# Patient Record
Sex: Female | Born: 1975 | Hispanic: Yes | Marital: Single | Smoking: Never smoker
Health system: Southern US, Community
[De-identification: ages and names within clinical notes are randomized; demographics above are authoritative.]

## PROBLEM LIST (undated history)

## (undated) DIAGNOSIS — I10 Essential (primary) hypertension: Secondary | ICD-10-CM

## (undated) DIAGNOSIS — J45909 Unspecified asthma, uncomplicated: Secondary | ICD-10-CM

## (undated) DIAGNOSIS — R87629 Unspecified abnormal cytological findings in specimens from vagina: Secondary | ICD-10-CM

## (undated) DIAGNOSIS — K760 Fatty (change of) liver, not elsewhere classified: Secondary | ICD-10-CM

## (undated) HISTORY — DX: Essential (primary) hypertension: I10

## (undated) HISTORY — PX: TUBAL LIGATION: SHX77

## (undated) HISTORY — PX: BREAST BIOPSY: SHX20

## (undated) HISTORY — DX: Unspecified abnormal cytological findings in specimens from vagina: R87.629

## (undated) HISTORY — PX: GALLBLADDER SURGERY: SHX652

## (undated) HISTORY — PX: COLONOSCOPY: SHX174

---

## 2005-09-18 DIAGNOSIS — O1490 Unspecified pre-eclampsia, unspecified trimester: Secondary | ICD-10-CM

## 2017-03-29 ENCOUNTER — Encounter (HOSPITAL_COMMUNITY): Payer: Self-pay | Admitting: Emergency Medicine

## 2017-03-29 ENCOUNTER — Emergency Department (HOSPITAL_COMMUNITY)
Admission: EM | Admit: 2017-03-29 | Discharge: 2017-03-29 | Disposition: A | Discharge status: Home / Self Care | Payer: Medicaid Other | Attending: Emergency Medicine | Admitting: Emergency Medicine

## 2017-03-29 ENCOUNTER — Emergency Department (HOSPITAL_COMMUNITY): Payer: Medicaid Other

## 2017-03-29 DIAGNOSIS — M791 Myalgia, unspecified site: Secondary | ICD-10-CM

## 2017-03-29 DIAGNOSIS — R21 Rash and other nonspecific skin eruption: Secondary | ICD-10-CM

## 2017-03-29 DIAGNOSIS — R7989 Other specified abnormal findings of blood chemistry: Secondary | ICD-10-CM

## 2017-03-29 DIAGNOSIS — R945 Abnormal results of liver function studies: Secondary | ICD-10-CM

## 2017-03-29 DIAGNOSIS — R6883 Chills (without fever): Secondary | ICD-10-CM

## 2017-03-29 DIAGNOSIS — R52 Pain, unspecified: Secondary | ICD-10-CM

## 2017-03-29 LAB — COMPREHENSIVE METABOLIC PANEL
ALT: 141 U/L — ABNORMAL HIGH (ref 14–54)
ANION GAP: 10 (ref 5–15)
AST: 88 U/L — ABNORMAL HIGH (ref 15–41)
Albumin: 3.7 g/dL (ref 3.5–5.0)
Alkaline Phosphatase: 104 U/L (ref 38–126)
BILIRUBIN TOTAL: 0.8 mg/dL (ref 0.3–1.2)
BUN: 9 mg/dL (ref 6–20)
CO2: 24 mmol/L (ref 22–32)
Calcium: 9.2 mg/dL (ref 8.9–10.3)
Chloride: 103 mmol/L (ref 101–111)
Creatinine, Ser: 0.92 mg/dL (ref 0.44–1.00)
Glucose, Bld: 101 mg/dL — ABNORMAL HIGH (ref 65–99)
POTASSIUM: 4 mmol/L (ref 3.5–5.1)
Sodium: 137 mmol/L (ref 135–145)
TOTAL PROTEIN: 7.6 g/dL (ref 6.5–8.1)

## 2017-03-29 LAB — URINALYSIS, ROUTINE W REFLEX MICROSCOPIC
BILIRUBIN URINE: NEGATIVE
GLUCOSE, UA: NEGATIVE mg/dL
KETONES UR: NEGATIVE mg/dL
LEUKOCYTES UA: NEGATIVE
Nitrite: NEGATIVE
PH: 5 (ref 5.0–8.0)
Protein, ur: NEGATIVE mg/dL
Specific Gravity, Urine: 1.015 (ref 1.005–1.030)

## 2017-03-29 LAB — CBC WITH DIFFERENTIAL/PLATELET
BASOS ABS: 0.1 10*3/uL (ref 0.0–0.1)
Basophils Relative: 1 %
EOS PCT: 1 %
Eosinophils Absolute: 0.1 10*3/uL (ref 0.0–0.7)
HEMATOCRIT: 37.9 % (ref 36.0–46.0)
HEMOGLOBIN: 12.6 g/dL (ref 12.0–15.0)
LYMPHS PCT: 12 %
Lymphs Abs: 1.1 10*3/uL (ref 0.7–4.0)
MCH: 26.9 pg (ref 26.0–34.0)
MCHC: 33.2 g/dL (ref 30.0–36.0)
MCV: 81 fL (ref 78.0–100.0)
Monocytes Absolute: 0.8 10*3/uL (ref 0.1–1.0)
Monocytes Relative: 8 %
NEUTROS ABS: 7.3 10*3/uL (ref 1.7–7.7)
NEUTROS PCT: 78 %
PLATELETS: 280 10*3/uL (ref 150–400)
RBC: 4.68 MIL/uL (ref 3.87–5.11)
RDW: 14.6 % (ref 11.5–15.5)
WBC: 9.3 10*3/uL (ref 4.0–10.5)

## 2017-03-29 LAB — I-STAT CG4 LACTIC ACID, ED
LACTIC ACID, VENOUS: 1.8 mmol/L (ref 0.5–1.9)
Lactic Acid, Venous: 1.01 mmol/L (ref 0.5–1.9)

## 2017-03-29 LAB — POC URINE PREG, ED: Preg Test, Ur: NEGATIVE

## 2017-03-29 MED ORDER — PREDNISONE 50 MG PO TABS: 50.0000 mg | ORAL_TABLET | Freq: Every day | ORAL | 0 refills | Status: AC

## 2017-03-29 MED ORDER — SODIUM CHLORIDE 0.9 % IV BOLUS (SEPSIS)
1000.0000 mL | Freq: Once | INTRAVENOUS | Status: AC
  Administered 2017-03-29: 1000 mL via INTRAVENOUS

## 2017-03-29 MED ORDER — DOXYCYCLINE HYCLATE 100 MG PO CAPS: 100.0000 mg | ORAL_CAPSULE | Freq: Two times a day (BID) | ORAL | 0 refills | Status: AC

## 2017-03-29 MED ORDER — IBUPROFEN 800 MG PO TABS
800.0000 mg | ORAL_TABLET | Freq: Once | ORAL | Status: AC
  Administered 2017-03-29: 800 mg via ORAL
  Filled 2017-03-29: qty 1

## 2017-03-29 MED ORDER — DOXYCYCLINE HYCLATE 100 MG PO TABS
100.0000 mg | ORAL_TABLET | Freq: Once | ORAL | Status: AC
  Administered 2017-03-29: 100 mg via ORAL
  Filled 2017-03-29: qty 1

## 2017-03-29 MED ORDER — PREDNISONE 20 MG PO TABS
60.0000 mg | ORAL_TABLET | Freq: Once | ORAL | Status: AC
  Administered 2017-03-29: 60 mg via ORAL
  Filled 2017-03-29: qty 3

## 2017-03-29 NOTE — Discharge Instructions (Signed)
Please take the doxycycline to empirically treat [facility]. Please take the steroids to help with your rash. Please stay hydrated. Please follow-up with your primary care physician for further management. If any symptoms change or worsen, please return to the nearest emergency department.

## 2017-03-29 NOTE — ED Provider Notes (Signed)
[facility]-EMERGENCY DEPT Provider Note   CSN: [id number] Arrival date & time: 03/29/17  1315     History   Chief Complaint Chief Complaint  Patient presents with  . Generalized Body Aches    HPI Chelsey Jordan is a 41 y.o. female.  The history is provided by the patient and the spouse. The history is limited by a language barrier.  Rash   This is a new problem. The current episode started more than 1 week ago. The problem has been gradually worsening. The problem is associated with nothing. The pain is moderate. The pain has been constant since onset. Associated symptoms include pain. She has tried nothing for the symptoms. The treatment provided no relief.    History reviewed. No pertinent past medical history.  There are no active problems to display for this patient.   History reviewed. No pertinent surgical history.  OB History    No data available       Home Medications    Prior to Admission medications   Not on File    Family History No family history on file.  Social History Social History  Substance Use Topics  . Smoking status: Never Smoker  . Smokeless tobacco: Never Used  . Alcohol use No     Allergies   Patient has no allergy information on record.   Review of Systems Review of Systems  Constitutional: Positive for chills, fatigue and fever. Negative for appetite change and diaphoresis.  HENT: Negative for congestion, rhinorrhea, sore throat, trouble swallowing and voice change.   Eyes: Negative for visual disturbance.  Respiratory: Negative for cough, chest tightness, shortness of breath, wheezing and stridor.   Cardiovascular: Negative for chest pain (resolved) and palpitations.  Gastrointestinal: Negative for abdominal pain, constipation, diarrhea, nausea and vomiting.  Genitourinary: Negative for difficulty urinating and dysuria.  Musculoskeletal: Negative for back pain, neck pain and neck stiffness.  Skin: Positive for rash.    Neurological: Positive for headaches. Negative for seizures, weakness and light-headedness.  Psychiatric/Behavioral: Negative for agitation and confusion.  All other systems reviewed and are negative.    Physical Exam Updated Vital Signs BP 118/74 (BP Location: Left Arm)   Pulse (!) 113   Temp 99.3 F (37.4 C) (Oral)   Resp 18   SpO2 98%   Physical Exam  Constitutional: She is oriented to person, place, and time. She appears well-developed and well-nourished. No distress.  HENT:  Head: Normocephalic and atraumatic.  Nose: Nose normal.  Mouth/Throat: Oropharynx is clear and moist. No oropharyngeal exudate.  Eyes: Pupils are equal, round, and reactive to light. Conjunctivae and EOM are normal.  Neck: Normal range of motion.  Cardiovascular: Normal heart sounds and intact distal pulses.  Tachycardia present.   No murmur heard. Pulmonary/Chest: Effort normal. No stridor. No respiratory distress. She has no wheezes. She has no rales. She exhibits no tenderness.  Abdominal: Soft. There is no tenderness.  Musculoskeletal: She exhibits no edema, tenderness or deformity.  Neurological: She is alert and oriented to person, place, and time. No sensory deficit. She exhibits normal muscle tone.  Skin: Capillary refill takes less than 2 seconds. Rash noted. Rash is maculopapular. She is not diaphoretic. There is erythema.  Nursing note and vitals reviewed.    ED Treatments / Results  Labs (all labs ordered are listed, but only abnormal results are displayed) Labs Reviewed  COMPREHENSIVE METABOLIC PANEL - Abnormal; Notable for the following:       Result Value   Glucose, Bld  101 (*)    AST 88 (*)    ALT 141 (*)    All other components within normal limits  URINALYSIS, ROUTINE W REFLEX MICROSCOPIC - Abnormal; Notable for the following:    APPearance HAZY (*)    Hgb urine dipstick MODERATE (*)    Bacteria, UA MANY (*)    Squamous Epithelial / LPF 0-5 (*)    All other components  within normal limits  URINE CULTURE  CBC WITH DIFFERENTIAL/PLATELET  ROCKY MTN SPOTTED FVR ABS PNL(IGG+IGM)  B. BURGDORFI ANTIBODIES  I-STAT CG4 LACTIC ACID, ED  POC URINE PREG, ED  I-STAT CG4 LACTIC ACID, ED    EKG  EKG Interpretation None       Radiology Dg Chest 2 View  Result Date: 03/29/2017 CLINICAL DATA:  Three-week history of cough, generalized weakness and diffuse body rash. EXAM: CHEST  2 VIEW COMPARISON:  None. FINDINGS: Lateral image is suboptimal due to underexposure related to body habitus. Suboptimal inspiration accounts for crowded bronchovascular markings diffusely and atelectasis in the bases, and accentuates the cardiac silhouette. Taking this into account, cardiomediastinal silhouette unremarkable. Lungs otherwise clear. Normal pulmonary vascularity. No visible pleural effusions. IMPRESSION: Suboptimal inspiration accounts for bibasilar atelectasis. No acute cardiopulmonary disease otherwise. Electronically Signed   By: Hulan Saas M.D.   On: 03/29/2017 16:23    Procedures Procedures (including critical care time)  Medications Ordered in ED Medications  sodium chloride 0.9 % bolus 1,000 mL (0 mLs Intravenous Stopped 03/29/17 1817)  doxycycline (VIBRA-TABS) tablet 100 mg (100 mg Oral Given 03/29/17 1708)  predniSONE (DELTASONE) tablet 60 mg (60 mg Oral Given 03/29/17 1707)  ibuprofen (ADVIL,MOTRIN) tablet 800 mg (800 mg Oral Given 03/29/17 1707)  sodium chloride 0.9 % bolus 1,000 mL (1,000 mLs Intravenous New Bag/Given 03/29/17 1910)     Initial Impression / Assessment and Plan / ED Course  I have reviewed the triage vital signs and the nursing notes.  Pertinent labs & imaging results that were available during my care of the patient were reviewed by me and considered in my medical decision making (see chart for details).     Chelsey Jordan is a 41 y.o. female with no significant past medical history who presents with fevers, chills, malaise, generalized  body aching, rash, headache, and dry cough. Patient reports that over the last 2 weeks, she developed rash all over her body. It is sparing her palms and soles. It does not involve her mucosal membranes. She says that she worked outside several weeks ago but did not know of any tick exposures. She describes the rash is nonpruritic but painful. She says that she developed chills, aching, and went to see a urgent care on Saturday. In urgent care, she told she had some chest discomfort with her generalized aching and she says they gave her aspirin and nitroglycerin. She says this made her headache worse and did not relieve her overall symptoms. She says that the next day she actually felt better but returned on Monday to the urgent care for continued symptoms. At that time, she was told she had an allergic reaction and was given a steroid shot and Atarax. She reports feeling better on Monday for the rest of the day but Tuesday symptoms returned. She says that she is continued to have a dry cough. She denies any nausea, vomiting, conservation, diarrhea, dysuria. She denies any traumas. He denies any sick contacts. She has never had this before.  She denies any neck pain or neck stiffness.  She denies any neurologic deficits. She denies abdominal pain. Denies any current chest pain or shortness of breath.  On exam, patient has large painful erythematous round rashes all over her body. Some areas have central clearing. Rash does appear raised in some places. Lungs are clear. Chest is nontender. Abdomen nontender. No focal neurologic deficits on exam. No rash and mouth or on gums.  Based on symptoms of chills, headache, rash, and fever in this area the country, [facility] spotted fever or other tickborne illnesses strongly considered. Patient will be empirically treated with doxycycline. As patient was tachycardic on arrival, she'll be given fluids and rehydrated. She will have screening laboratory testing.  Laboratory testing from triage show elevation in liver function. This may recommend spotted fever. Patient is not having tenderness over the right upper quadrant, doubt acute cholecystitis. Patient will have RMSF and Lyme tests collected. Also considered are discoid lupus or cutaneous lupus with erythema migrans appearing rashes. Patient may also have a component of allergic reaction some of the rashes appear wheal like.   They stop lack of neck pain and neck stiffness, and the appearance of the rash, do not feel patient has meningitis.  Patient will be given prednisone as well as the doxycycline. Patient we given fluids. Patient will have chest x-ray given the cough as well as urine testing.  Anticipate reassessment after medications. Patient is feeling better, suspect patient will be stable for discharge with close PCP follow-up for further workup to look for lupus as an outpatient.  8:41 PM Patient reassessed with improvement in tachycardia, fatigue, and complaints. Patient had resolution of her headache and body aching. Patient's rash began to improve after steroids.  Patient will begin a prescription for steroids. Patient also be given prescription for doxycycline to impaired to treat for RMSF. Tickborne illness labs were sent. Patient instructed to follow-up with PCP in the next several days for reassessment as well as for further workup of possible lupus. Family does report that there is a family history of lupus.  Patient family understood return precautions and plan of care. Patient discharged in good condition.   Final Clinical Impressions(s) / ED Diagnoses   Final diagnoses:  Rash  Myalgia  Body aches  Chills  LFT elevation    New Prescriptions New Prescriptions   DOXYCYCLINE (VIBRAMYCIN) 100 MG CAPSULE    Take 1 capsule (100 mg total) by mouth 2 (two) times daily.   PREDNISONE (DELTASONE) 50 MG TABLET    Take 1 tablet (50 mg total) by mouth daily.    Clinical  Impression: 1. Rash   2. Myalgia   3. Body aches   4. Chills   5. LFT elevation     Disposition: Discharge  Condition: Good  I have discussed the results, Dx and Tx plan with the pt(& family if present). He/she/they expressed understanding and agree(s) with the plan. Discharge instructions discussed at great length. Strict return precautions discussed and pt &/or family have verbalized understanding of the instructions. No further questions at time of discharge.    New Prescriptions   DOXYCYCLINE (VIBRAMYCIN) 100 MG CAPSULE    Take 1 capsule (100 mg total) by mouth 2 (two) times daily.   PREDNISONE (DELTASONE) 50 MG TABLET    Take 1 tablet (50 mg total) by mouth daily.    Follow Up: [facility] [address] [place] [address] [phone] Schedule an appointment as soon as possible for a visit  [facility] [address] [id number] [place] [id number] [phone]  If symptoms worsen     Tegeler, Canary Brimhristopher J, MD 03/30/17 [phone]

## 2017-03-29 NOTE — ED Triage Notes (Signed)
Pt complaint of generalized body aches with "red spots all of body" worsening over past 3 weeks; recently diagnosed with "allergic reaction from environment."

## 2017-03-29 NOTE — ED Notes (Signed)
Pt ambulatory and independent at discharge.  Verbalized understanding of discharge instructions 

## 2017-03-29 NOTE — ED Notes (Signed)
Patient transported to X-ray 

## 2017-03-30 LAB — ROCKY MTN SPOTTED FVR ABS PNL(IGG+IGM)
RMSF IgG: NEGATIVE
RMSF IgM: 0.92 index — ABNORMAL HIGH (ref 0.00–0.89)

## 2017-03-31 LAB — URINE CULTURE

## 2017-04-04 LAB — LYME, WESTERN BLOT, SERUM (REFLEXED)
IGG P45 AB.: ABSENT
IGG P58 AB.: ABSENT
IGG P66 AB.: ABSENT
IgG P18 Ab.: ABSENT
IgG P28 Ab.: ABSENT
IgG P30 Ab.: ABSENT
IgG P39 Ab.: ABSENT
IgG P93 Ab.: ABSENT
Lyme IgG Wb: NEGATIVE
Lyme IgM Wb: POSITIVE — AB

## 2017-04-04 LAB — B. BURGDORFI ANTIBODIES: B burgdorferi Ab IgG+IgM: 3.64 {ISR} — ABNORMAL HIGH (ref 0.00–0.90)

## 2018-12-05 ENCOUNTER — Encounter: Payer: Self-pay | Admitting: *Deleted

## 2018-12-06 ENCOUNTER — Other Ambulatory Visit: Payer: Self-pay | Admitting: Physician Assistant

## 2018-12-06 DIAGNOSIS — Z1231 Encounter for screening mammogram for malignant neoplasm of breast: Secondary | ICD-10-CM

## 2018-12-20 ENCOUNTER — Other Ambulatory Visit: Payer: Self-pay

## 2018-12-20 ENCOUNTER — Other Ambulatory Visit (HOSPITAL_COMMUNITY)
Admission: RE | Admit: 2018-12-20 | Discharge: 2018-12-20 | Disposition: A | Discharge status: Home / Self Care | Payer: Medicaid Other | Source: Ambulatory Visit | Attending: Obstetrics & Gynecology | Admitting: Obstetrics & Gynecology

## 2018-12-20 ENCOUNTER — Ambulatory Visit (INDEPENDENT_AMBULATORY_CARE_PROVIDER_SITE_OTHER): Payer: Medicaid Other | Admitting: Obstetrics & Gynecology

## 2018-12-20 ENCOUNTER — Encounter: Payer: Self-pay | Admitting: Obstetrics & Gynecology

## 2018-12-20 VITALS — BP 143/86 | HR 96 | Ht 63.5 in | Wt 206.7 lb

## 2018-12-20 DIAGNOSIS — Z789 Other specified health status: Secondary | ICD-10-CM | POA: Diagnosis not present

## 2018-12-20 DIAGNOSIS — N951 Menopausal and female climacteric states: Secondary | ICD-10-CM | POA: Insufficient documentation

## 2018-12-20 DIAGNOSIS — Z124 Encounter for screening for malignant neoplasm of cervix: Secondary | ICD-10-CM | POA: Diagnosis present

## 2018-12-20 DIAGNOSIS — Z6836 Body mass index (BMI) 36.0-36.9, adult: Secondary | ICD-10-CM | POA: Diagnosis not present

## 2018-12-20 DIAGNOSIS — E669 Obesity, unspecified: Secondary | ICD-10-CM | POA: Insufficient documentation

## 2018-12-20 DIAGNOSIS — N912 Amenorrhea, unspecified: Secondary | ICD-10-CM | POA: Diagnosis not present

## 2018-12-20 DIAGNOSIS — E6609 Other obesity due to excess calories: Secondary | ICD-10-CM | POA: Diagnosis not present

## 2018-12-20 DIAGNOSIS — Z3202 Encounter for pregnancy test, result negative: Secondary | ICD-10-CM

## 2018-12-20 DIAGNOSIS — Z0000 Encounter for general adult medical examination without abnormal findings: Secondary | ICD-10-CM

## 2018-12-20 LAB — CHG URINE PREGNANCY TEST: Preg Test, Ur: NEGATIVE

## 2018-12-20 MED ORDER — MEDROXYPROGESTERONE ACETATE 10 MG PO TABS: 10.0000 mg | ORAL_TABLET | Freq: Every day | ORAL | 2 refills | Status: DC

## 2018-12-20 NOTE — Progress Notes (Signed)
Patient ID: Chelsey Jordan, female   DOB: Nov 04, 1975, 43 y.o.   MRN: [id number]  Chief Complaint  Patient presents with  . Amenorrhea    HPI Chelsey Jordan is a 43 y.o. single P2 (23 and 32 yo kids) female.  She would like a pap smear. She reports an abnormal pap [place] in about [place]  She also reports that she has not had a period since 10/19.  She had a BTL in about 2008.  She has not taken any meds to induce menses. She reports that until 10/19 her periods were monthly. She cannot think of anything that happened then, denies weight gain or loss. She is abstinent for more than 2 years.   Past Medical History:  Diagnosis Date  . Hypertension   . Vaginal Pap smear, abnormal     Past Surgical History:  Procedure Laterality Date  . CESAREAN SECTION     x2  . GALLBLADDER SURGERY    . TUBAL LIGATION      Family History  Problem Relation Age of Onset  . Hypertension Father   . Heart disease Father   . Hypertension Mother   . Lung disease Mother   . Lupus Mother     Social History Social History   Tobacco Use  . Smoking status: Former Games developer  . Smokeless tobacco: Never Used  Substance Use Topics  . Alcohol use: No  . Drug use: No    No Known Allergies  Current Outpatient Medications  Medication Sig Dispense Refill  . acetaminophen (TYLENOL) 500 MG tablet Take 500 mg by mouth every 6 (six) hours as needed for headache.    . Multiple Vitamins-Minerals (MULTIVITAMIN PO) Take by mouth.     No current facility-administered medications for this visit.     Review of Systems Review of Systems  unemployed  Blood pressure (!) 143/86, pulse 96, height 5' 3.5" (1.613 m), weight 206 lb 11.2 oz (93.8 kg), last menstrual period 09/01/2018. UPT negative  Physical Exam Physical Exam Video interpretor used for visit Breathing, conversing, and ambulating normally Well nourished, well hydrated Latina, no apparent distress  Abd- benign, obese Vulva/vagina/cervix-  normal  Data Reviewed Labs noted  Assessment  Preventative care Amenorrhea  Plan Pap with cotesting Check TSH, Prolactin Mammogram scheduled in May 2020. Provera cyclicly  Chelsey Jordan 12/20/2018, 11:45 AM

## 2018-12-21 LAB — PROLACTIN: Prolactin: 12.8 ng/mL (ref 4.8–23.3)

## 2018-12-21 LAB — TSH: TSH: 1.34 u[IU]/mL (ref 0.450–4.500)

## 2018-12-25 LAB — CYTOLOGY - PAP
Chlamydia: NEGATIVE
Diagnosis: NEGATIVE
HPV: NOT DETECTED
Neisseria Gonorrhea: NEGATIVE

## 2019-01-24 ENCOUNTER — Ambulatory Visit: Payer: Self-pay

## 2019-02-20 ENCOUNTER — Ambulatory Visit
Admission: RE | Admit: 2019-02-20 | Discharge: 2019-02-20 | Disposition: A | Discharge status: Home / Self Care | Payer: Medicaid Other | Source: Ambulatory Visit | Attending: Physician Assistant | Admitting: Physician Assistant

## 2019-02-20 ENCOUNTER — Other Ambulatory Visit: Payer: Self-pay | Admitting: Physician Assistant

## 2019-02-20 ENCOUNTER — Ambulatory Visit: Payer: Medicaid Other

## 2019-02-20 ENCOUNTER — Other Ambulatory Visit: Payer: Self-pay

## 2019-02-20 DIAGNOSIS — N644 Mastodynia: Secondary | ICD-10-CM

## 2019-02-20 DIAGNOSIS — Z1231 Encounter for screening mammogram for malignant neoplasm of breast: Secondary | ICD-10-CM

## 2019-02-25 ENCOUNTER — Other Ambulatory Visit: Payer: Self-pay | Admitting: Physician Assistant

## 2019-03-07 ENCOUNTER — Other Ambulatory Visit: Payer: Self-pay | Admitting: Physician Assistant

## 2019-03-07 ENCOUNTER — Other Ambulatory Visit: Payer: Self-pay

## 2019-03-07 ENCOUNTER — Ambulatory Visit
Admission: RE | Admit: 2019-03-07 | Discharge: 2019-03-07 | Disposition: A | Discharge status: Home / Self Care | Payer: Medicaid Other | Source: Ambulatory Visit | Attending: Physician Assistant | Admitting: Physician Assistant

## 2019-03-07 DIAGNOSIS — N644 Mastodynia: Secondary | ICD-10-CM

## 2019-03-07 DIAGNOSIS — N6489 Other specified disorders of breast: Secondary | ICD-10-CM

## 2019-03-07 DIAGNOSIS — N6310 Unspecified lump in the right breast, unspecified quadrant: Secondary | ICD-10-CM

## 2019-03-10 ENCOUNTER — Ambulatory Visit
Admission: RE | Admit: 2019-03-10 | Discharge: 2019-03-10 | Disposition: A | Discharge status: Home / Self Care | Payer: Medicaid Other | Source: Ambulatory Visit | Attending: Physician Assistant | Admitting: Physician Assistant

## 2019-03-10 ENCOUNTER — Other Ambulatory Visit: Payer: Self-pay | Admitting: Physician Assistant

## 2019-03-10 ENCOUNTER — Other Ambulatory Visit: Payer: Self-pay

## 2019-03-10 DIAGNOSIS — N6310 Unspecified lump in the right breast, unspecified quadrant: Secondary | ICD-10-CM

## 2019-09-25 ENCOUNTER — Other Ambulatory Visit: Payer: Self-pay | Admitting: Physician Assistant

## 2019-09-25 DIAGNOSIS — N6320 Unspecified lump in the left breast, unspecified quadrant: Secondary | ICD-10-CM

## 2019-09-26 ENCOUNTER — Other Ambulatory Visit: Payer: Self-pay | Admitting: Physician Assistant

## 2019-09-26 DIAGNOSIS — N6320 Unspecified lump in the left breast, unspecified quadrant: Secondary | ICD-10-CM

## 2019-09-29 ENCOUNTER — Other Ambulatory Visit: Payer: Self-pay | Admitting: Physician Assistant

## 2019-09-29 DIAGNOSIS — N6320 Unspecified lump in the left breast, unspecified quadrant: Secondary | ICD-10-CM

## 2019-10-01 ENCOUNTER — Other Ambulatory Visit: Payer: Self-pay | Admitting: Physician Assistant

## 2019-10-03 ENCOUNTER — Ambulatory Visit
Admission: RE | Admit: 2019-10-03 | Discharge: 2019-10-03 | Disposition: A | Discharge status: Home / Self Care | Payer: Medicaid Other | Source: Ambulatory Visit | Attending: Physician Assistant | Admitting: Physician Assistant

## 2019-10-03 ENCOUNTER — Other Ambulatory Visit: Payer: Self-pay | Admitting: Physician Assistant

## 2019-10-03 ENCOUNTER — Other Ambulatory Visit: Payer: Self-pay

## 2019-10-03 DIAGNOSIS — N6320 Unspecified lump in the left breast, unspecified quadrant: Secondary | ICD-10-CM

## 2019-12-19 ENCOUNTER — Ambulatory Visit: Payer: Medicaid Other | Attending: Internal Medicine

## 2019-12-19 DIAGNOSIS — Z23 Encounter for immunization: Secondary | ICD-10-CM

## 2019-12-19 NOTE — Progress Notes (Signed)
   Covid-19 Vaccination Clinic  Name:  Chelsey Jordan    MRN: [id number] DOB: 1976/04/25  12/19/2019  Ms. Chelsey Jordan was observed post Covid-19 immunization for 15 minutes without incident. She was provided with Vaccine Information Sheet and instruction to access the V-Safe system.   Ms. Chelsey Jordan was instructed to call 911 with any severe reactions post vaccine: Marland Kitchen Difficulty breathing  . Swelling of face and throat  . A fast heartbeat  . A bad rash all over body  . Dizziness and weakness   Immunizations Administered    Name Date Dose VIS Date Route   Pfizer COVID-19 Vaccine 12/19/2019 11:22 AM 0.3 mL 08/29/2019 Intramuscular   Manufacturer: [employer], [employer]   Lot: [id number]   NDC: [address]-1

## 2020-01-13 ENCOUNTER — Ambulatory Visit: Payer: Medicaid Other | Attending: Internal Medicine

## 2020-01-13 DIAGNOSIS — Z23 Encounter for immunization: Secondary | ICD-10-CM

## 2020-01-13 NOTE — Progress Notes (Signed)
   Covid-19 Vaccination Clinic  Name:  Chelsey Jordan    MRN: [id number] DOB: Aug 29, 1976  01/13/2020  Ms. Chelsey Jordan was observed post Covid-19 immunization for 15 minutes without incident. She was provided with Vaccine Information Sheet and instruction to access the V-Safe system.   Ms. Chelsey Jordan was instructed to call 911 with any severe reactions post vaccine: Marland Kitchen Difficulty breathing  . Swelling of face and throat  . A fast heartbeat  . A bad rash all over body  . Dizziness and weakness   Immunizations Administered    Name Date Dose VIS Date Route   Pfizer COVID-19 Vaccine 01/13/2020  2:39 PM 0.3 mL 11/12/2018 Intramuscular   Manufacturer: [employer], [employer]   Lot: [id number]   NDC: [address]-9

## 2020-04-02 ENCOUNTER — Other Ambulatory Visit: Payer: Self-pay

## 2020-04-02 ENCOUNTER — Ambulatory Visit
Admission: RE | Admit: 2020-04-02 | Discharge: 2020-04-02 | Disposition: A | Discharge status: Home / Self Care | Payer: Medicaid Other | Source: Ambulatory Visit | Attending: Physician Assistant | Admitting: Physician Assistant

## 2020-04-02 DIAGNOSIS — N6320 Unspecified lump in the left breast, unspecified quadrant: Secondary | ICD-10-CM

## 2021-04-05 HISTORY — PX: COLONOSCOPY: SHX174

## 2021-11-21 ENCOUNTER — Other Ambulatory Visit: Payer: Self-pay | Admitting: Physician Assistant

## 2021-11-21 ENCOUNTER — Other Ambulatory Visit: Payer: Self-pay | Admitting: Family Medicine

## 2021-11-21 DIAGNOSIS — N63 Unspecified lump in unspecified breast: Secondary | ICD-10-CM

## 2021-11-21 DIAGNOSIS — Z09 Encounter for follow-up examination after completed treatment for conditions other than malignant neoplasm: Secondary | ICD-10-CM

## 2021-12-12 ENCOUNTER — Other Ambulatory Visit: Payer: Self-pay

## 2021-12-12 ENCOUNTER — Ambulatory Visit
Admission: RE | Admit: 2021-12-12 | Discharge: 2021-12-12 | Disposition: A | Discharge status: Home / Self Care | Payer: Medicaid Other | Source: Ambulatory Visit | Attending: Family Medicine | Admitting: Family Medicine

## 2021-12-12 DIAGNOSIS — Z09 Encounter for follow-up examination after completed treatment for conditions other than malignant neoplasm: Secondary | ICD-10-CM

## 2021-12-12 DIAGNOSIS — N63 Unspecified lump in unspecified breast: Secondary | ICD-10-CM

## 2022-01-03 ENCOUNTER — Other Ambulatory Visit (HOSPITAL_COMMUNITY)
Admission: RE | Admit: 2022-01-03 | Discharge: 2022-01-03 | Disposition: A | Discharge status: Home / Self Care | Payer: Medicaid Other | Source: Ambulatory Visit | Attending: Obstetrics & Gynecology | Admitting: Obstetrics & Gynecology

## 2022-01-03 ENCOUNTER — Ambulatory Visit (INDEPENDENT_AMBULATORY_CARE_PROVIDER_SITE_OTHER): Payer: Medicaid Other | Admitting: Obstetrics & Gynecology

## 2022-01-03 ENCOUNTER — Encounter: Payer: Self-pay | Admitting: Obstetrics & Gynecology

## 2022-01-03 VITALS — BP 128/86 | HR 86 | Wt 222.0 lb

## 2022-01-03 DIAGNOSIS — Z01419 Encounter for gynecological examination (general) (routine) without abnormal findings: Secondary | ICD-10-CM | POA: Insufficient documentation

## 2022-01-03 NOTE — Progress Notes (Signed)
? ? ?GYNECOLOGY ANNUAL PREVENTATIVE CARE ENCOUNTER NOTE ? ?History:    ? Chelsey Jordan is a 46 y.o. [phone] female here for a routine annual gynecologic exam.  Patient is Spanish-speaking only, declined medical interpreter and wanted to use husband as her interpreter for this encounter today. Current complaints:  some hip bone pain, had negative evaluation.   Denies abnormal vaginal bleeding, discharge, pelvic pain, problems with intercourse or other gynecologic concerns.  ?  ?Gynecologic History ?No LMP recorded. Patient is perimenopausal. ?Last Pap: 12/20/2018. Result was normal with negative HPV ?Last Mammogram: 12/12/2021.  Result was normal ?Last Colonoscopy: 04/05/2021.  Done at [facility], hyperplastic polyps seen, repeat in 3 years  ? ?Obstetric History ?OB History  ?Gravida Para Term Preterm AB Living  ?3 2 1 1 1 2   ?SAB IAB Ectopic Multiple Live Births  ?1       2  ?  ?# Outcome Date GA Lbr Len/2nd Weight Sex Delivery Anes PTL Lv  ?3 Term 11/2006    M CS-Unspec   LIV  ?2 Preterm 09/2005 [redacted]w[redacted]d   F CS-Unspec   LIV  ?   Complications: Preeclampsia  ?1 SAB           ? ? ?Past Medical History:  ?Diagnosis Date  ? Hypertension   ? Vaginal Pap smear, abnormal   ? ? ?Past Surgical History:  ?Procedure Laterality Date  ? BREAST BIOPSY    ? CESAREAN SECTION    ? x2  ? GALLBLADDER SURGERY    ? TUBAL LIGATION    ? ? ?Current Outpatient Medications on File Prior to Visit  ?Medication Sig Dispense Refill  ? OZEMPIC, 0.25 OR 0.5 MG/DOSE, 2 MG/3ML SOPN SMARTSIG:0.25 Milligram(s) SUB-Q Once a Week    ? Vitamin D, Ergocalciferol, (DRISDOL) 1.25 MG (50000 UNIT) CAPS capsule Take 50,000 Units by mouth once a week.    ? ?No current facility-administered medications on file prior to visit.  ? ? ?No Known Allergies ? ?Social History:  reports that she has quit smoking. She has never used smokeless tobacco. She reports that she does not drink alcohol and does not use drugs. ? ?Family History  ?Problem Relation Age of  Onset  ? Hypertension Father   ? Heart disease Father   ? Hypertension Mother   ? Lung disease Mother   ? Lupus Mother   ? ? ?The following portions of the patient's history were reviewed and updated as appropriate: allergies, current medications, past family history, past medical history, past social history, past surgical history and problem list. ? ?Review of Systems ?Pertinent items noted in HPI and remainder of comprehensive ROS otherwise negative. ? ?Physical Exam:  ?BP 128/86   Pulse 86   Wt 222 lb (100.7 kg)   BMI 38.71 kg/m?  ?CONSTITUTIONAL: Well-developed, well-nourished female in no acute distress.  ?HENT:  Normocephalic, atraumatic, External right and left ear normal.  ?EYES: Conjunctivae and EOM are normal. Pupils are equal, round, and reactive to light. No scleral icterus.  ?NECK: Normal range of motion, supple, no masses.  Normal thyroid.  ?SKIN: Skin is warm and dry. No rash noted. Not diaphoretic. No erythema. No pallor. ?MUSCULOSKELETAL: Normal range of motion. No tenderness.  No cyanosis, clubbing, or edema. ?NEUROLOGIC: Alert and oriented to person, place, and time. Normal reflexes, muscle tone coordination.  ?PSYCHIATRIC: Normal mood and affect. Normal behavior. Normal judgment and thought content. ?CARDIOVASCULAR: Normal heart rate noted, regular rhythm ?RESPIRATORY: Clear to auscultation bilaterally. Effort and breath  sounds normal, no problems with respiration noted. ?BREASTS: Symmetric in size. No masses, tenderness, skin changes, nipple drainage, or lymphadenopathy bilaterally. Performed in the presence of a chaperone. ?ABDOMEN: Soft, no distention noted.  No tenderness, rebound or guarding.  ?PELVIC: Normal appearing external genitalia and urethral meatus; normal appearing vaginal mucosa and cervix.  No abnormal vaginal discharge noted.  Pap smear obtained, endocervical stenosis noted.  Normal uterine size, no other palpable masses, no uterine or adnexal tenderness.  Some tenderness on  palpation of right inferior pubic ramus. Performed in the presence of a chaperone. ?  ?Assessment and Plan:  ?  1. Encounter for gynecological examination with Papanicolaou smear of cervix ?- Cytology - PAP ?Will follow up results of pap smear and manage accordingly. ?Mammogram and colon cancer screening are up to date. ?Routine preventative health maintenance measures emphasized. ?Please refer to After Visit Summary for other counseling recommendations.  ?   ? ?Jaynie Collins, MD, FACOG ?Obstetrician Heritage manager, Faculty Practice ?Center for [employer], [facility] ? ?

## 2022-01-06 LAB — CYTOLOGY - PAP
Adequacy: ABSENT
Comment: NEGATIVE
Diagnosis: NEGATIVE
High risk HPV: NEGATIVE

## 2023-01-10 ENCOUNTER — Emergency Department (HOSPITAL_COMMUNITY): Payer: Medicaid Other

## 2023-01-10 ENCOUNTER — Other Ambulatory Visit: Payer: Self-pay

## 2023-01-10 ENCOUNTER — Emergency Department (HOSPITAL_COMMUNITY)
Admission: EM | Admit: 2023-01-10 | Discharge: 2023-01-11 | Disposition: A | Discharge status: Home / Self Care | Payer: Medicaid Other | Attending: Emergency Medicine | Admitting: Emergency Medicine

## 2023-01-10 ENCOUNTER — Encounter (HOSPITAL_COMMUNITY): Payer: Self-pay

## 2023-01-10 DIAGNOSIS — K529 Noninfective gastroenteritis and colitis, unspecified: Secondary | ICD-10-CM | POA: Diagnosis not present

## 2023-01-10 DIAGNOSIS — I10 Essential (primary) hypertension: Secondary | ICD-10-CM | POA: Diagnosis not present

## 2023-01-10 DIAGNOSIS — R1010 Upper abdominal pain, unspecified: Secondary | ICD-10-CM | POA: Diagnosis present

## 2023-01-10 LAB — COMPREHENSIVE METABOLIC PANEL
ALT: 33 U/L (ref 0–44)
AST: 23 U/L (ref 15–41)
Albumin: 3.5 g/dL (ref 3.5–5.0)
Alkaline Phosphatase: 85 U/L (ref 38–126)
Anion gap: 7 (ref 5–15)
BUN: 12 mg/dL (ref 6–20)
CO2: 21 mmol/L — ABNORMAL LOW (ref 22–32)
Calcium: 8.2 mg/dL — ABNORMAL LOW (ref 8.9–10.3)
Chloride: 110 mmol/L (ref 98–111)
Creatinine, Ser: 0.78 mg/dL (ref 0.44–1.00)
GFR, Estimated: >60 mL/min (ref >60)
Glucose, Bld: 106 mg/dL — ABNORMAL HIGH (ref 70–99)
Potassium: 3.4 mmol/L — ABNORMAL LOW (ref 3.5–5.1)
Sodium: 138 mmol/L (ref 135–145)
Total Bilirubin: 0.8 mg/dL (ref 0.3–1.2)
Total Protein: 6.7 g/dL (ref 6.5–8.1)

## 2023-01-10 LAB — URINALYSIS, ROUTINE W REFLEX MICROSCOPIC
Bilirubin Urine: NEGATIVE
Glucose, UA: NEGATIVE mg/dL
Hgb urine dipstick: NEGATIVE
Ketones, ur: NEGATIVE mg/dL
Leukocytes,Ua: NEGATIVE
Nitrite: NEGATIVE
Protein, ur: 30 mg/dL — AB
Specific Gravity, Urine: 1.025 (ref 1.005–1.030)
pH: 5 (ref 5.0–8.0)

## 2023-01-10 LAB — CBC
HCT: 44.8 % (ref 36.0–46.0)
Hemoglobin: 14.3 g/dL (ref 12.0–15.0)
MCH: 27 pg (ref 26.0–34.0)
MCHC: 31.9 g/dL (ref 30.0–36.0)
MCV: 84.5 fL (ref 80.0–100.0)
Platelets: 289 10*3/uL (ref 150–400)
RBC: 5.3 MIL/uL — ABNORMAL HIGH (ref 3.87–5.11)
RDW: 14.6 % (ref 11.5–15.5)
WBC: 10.2 10*3/uL (ref 4.0–10.5)
nRBC: 0 % (ref 0.0–0.2)

## 2023-01-10 LAB — POC URINE PREG, ED: Preg Test, Ur: NEGATIVE

## 2023-01-10 LAB — LIPASE, BLOOD: Lipase: 38 U/L (ref 11–51)

## 2023-01-10 MED ORDER — IOHEXOL 300 MG/ML  SOLN
100.0000 mL | Freq: Once | INTRAMUSCULAR | Status: AC | PRN
  Administered 2023-01-10: 100 mL via INTRAVENOUS

## 2023-01-10 MED ORDER — SODIUM CHLORIDE 0.9 % IV BOLUS
1000.0000 mL | Freq: Once | INTRAVENOUS | Status: AC
  Administered 2023-01-10: 1000 mL via INTRAVENOUS

## 2023-01-10 MED ORDER — HYDROMORPHONE HCL 1 MG/ML IJ SOLN
1.0000 mg | Freq: Once | INTRAMUSCULAR | Status: AC
  Administered 2023-01-10: 1 mg via INTRAVENOUS
  Filled 2023-01-10: qty 1

## 2023-01-10 MED ORDER — ONDANSETRON HCL 4 MG/2ML IJ SOLN
4.0000 mg | Freq: Once | INTRAMUSCULAR | Status: AC
  Administered 2023-01-10: 4 mg via INTRAVENOUS
  Filled 2023-01-10: qty 2

## 2023-01-10 NOTE — ED Provider Notes (Incomplete)
[place] EMERGENCY DEPARTMENT AT [facility] Provider Note   CSN: [id number] Arrival date & time: 01/10/23  2020     History {Add pertinent medical, surgical, social history, OB history to HPI:1} Chief Complaint  Patient presents with  . Emesis    Chelsey Jordan is a 47 y.o. female.  The history is provided by the patient and a relative. The history is limited by a language barrier. A language interpreter was used (Relative at bedside).  Emesis Associated symptoms: abdominal pain, chills and diarrhea   Associated symptoms: no fever        Chelsey Jordan is a 47 y.o. female history of HTN who presents to the Emergency Department complaining of intermittent abdominal pain, nausea, vomiting.  Symptoms began 2 to 3 weeks ago, worse x 2 days.  Describes intermittent pain of her upper abdomen.  Pain is nonradiating.  Pain worsens with food intake.  States she typically eats solid food and has vomiting.  Feels like her abdomen is swollen.  She also reports multiple episodes of loose to watery stools, nonbloody or black.  Chills at home.  Did  not take her temperature.  Denies flank pain, dysuria, chest pain or shortness of breath.  History of prior cholecystectomy many years ago    Home Medications Prior to Admission medications   Medication Sig Start Date End Date Taking? Authorizing Provider  OZEMPIC, 0.25 OR 0.5 MG/DOSE, 2 MG/3ML SOPN SMARTSIG:0.25 Milligram(s) SUB-Q Once a Week 12/19/21   [provider]  Vitamin D, Ergocalciferol, (DRISDOL) 1.25 MG (50000 UNIT) CAPS capsule Take 50,000 Units by mouth once a week. 12/09/21   [provider]      Allergies    Patient has no known allergies.    Review of Systems   Review of Systems  Constitutional:  Positive for appetite change and chills. Negative for fever.  Respiratory:  Negative for chest tightness and shortness of breath.   Cardiovascular:  Negative for chest pain.  Gastrointestinal:   Positive for abdominal pain, diarrhea, nausea and vomiting. Negative for blood in stool.  Genitourinary:  Negative for dysuria and flank pain.  Musculoskeletal:  Negative for back pain.  Skin:  Negative for rash.  Neurological:  Negative for dizziness, syncope, weakness and numbness.    Physical Exam Updated Vital Signs BP (!) 135/95 (BP Location: Right Arm)   Pulse 99   Temp 97.7 F (36.5 C) (Oral)   Resp 15   Ht  (1.575 m)   Wt 93 kg   SpO2 96%   BMI 37.49 kg/m  Physical Exam Vitals and nursing note reviewed.  Constitutional:      General: She is not in acute distress.    Appearance: Normal appearance. She is not ill-appearing or toxic-appearing.  HENT:     Mouth/Throat:     Mouth: Mucous membranes are moist.     Pharynx: Oropharynx is clear. No oropharyngeal exudate or posterior oropharyngeal erythema.  Cardiovascular:     Rate and Rhythm: Normal rate and regular rhythm.     Pulses: Normal pulses.  Pulmonary:     Effort: Pulmonary effort is normal. No respiratory distress.  Abdominal:     Palpations: Abdomen is soft. There is no mass.     Tenderness: There is abdominal tenderness. There is no right CVA tenderness, left CVA tenderness or guarding.     Comments: Tenderness to palpation of the epigastrium and right lower quadrant.  No guarding or rebound.  Abdomen is  soft  Musculoskeletal:     Right lower leg: No edema.     Left lower leg: No edema.  Skin:    General: Skin is warm.     Capillary Refill: Capillary refill takes less than 2 seconds.  Neurological:     General: No focal deficit present.     Mental Status: She is alert.     Sensory: No sensory deficit.     Motor: No weakness.     ED Results / Procedures / Treatments   Labs (all labs ordered are listed, but only abnormal results are displayed) Labs Reviewed  CBC - Abnormal; Notable for the following components:      Result Value   RBC 5.30 (*)    All other components within normal limits   URINALYSIS, ROUTINE W REFLEX MICROSCOPIC - Abnormal; Notable for the following components:   APPearance CLOUDY (*)    Protein, ur 30 (*)    Bacteria, UA MANY (*)    All other components within normal limits  COMPREHENSIVE METABOLIC PANEL - Abnormal; Notable for the following components:   Potassium 3.4 (*)    CO2 21 (*)    Glucose, Bld 106 (*)    Calcium 8.2 (*)    All other components within normal limits  LIPASE, BLOOD  POC URINE PREG, ED    EKG None  Radiology CT ABDOMEN PELVIS W CONTRAST  Result Date: 01/10/2023 CLINICAL DATA:  Acute nonlocalized abdominal pain. Epigastric pain. Vomiting and diarrhea for 2 days. EXAM: CT ABDOMEN AND PELVIS WITH CONTRAST TECHNIQUE: Multidetector CT imaging of the abdomen and pelvis was performed using the standard protocol following bolus administration of intravenous contrast. RADIATION DOSE REDUCTION: This exam was performed according to the departmental dose-optimization program which includes automated exposure control, adjustment of the mA and/or kV according to patient size and/or use of iterative reconstruction technique. CONTRAST:  OMNIPAQUE IOHEXOL 300 MG/ML  SOLN COMPARISON:  None Available. FINDINGS: Lower chest: Dependent atelectasis in the lung bases. Hepatobiliary: No focal liver abnormality is seen. Status post cholecystectomy. No biliary dilatation. Pancreas: Unremarkable. No pancreatic ductal dilatation or surrounding inflammatory changes. Spleen: Normal in size without focal abnormality. Adrenals/Urinary Tract: Adrenal glands are unremarkable. Kidneys are normal, without renal calculi, focal lesion, or hydronephrosis. Bladder is unremarkable. Stomach/Bowel: Stomach, small bowel, and colon are not abnormally distended. Fluid-filled colon consistent with liquid stool. Wall thickening in the rectosigmoid colon suggesting evidence of colitis. No evidence of acute diverticulitis. Appendix is normal. Vascular/Lymphatic: No significant  vascular findings are present. No enlarged abdominal or pelvic lymph nodes. Reproductive: Uterus and bilateral adnexa are unremarkable. Other: No abdominal wall hernia or abnormality. No abdominopelvic ascites. Musculoskeletal: No acute or significant osseous findings. IMPRESSION: 1. Liquid stool in the colon with wall thickening in the rectosigmoid region suggesting colitis consistent with history of diarrhea. 2. No evidence of bowel obstruction. Electronically Signed   By: Burman Nieves M.D.   On: 01/10/2023 23:50    Procedures Procedures  {Document cardiac monitor, telemetry assessment procedure when appropriate:1}  Medications Ordered in ED Medications  sodium chloride 0.9 % bolus 1,000 mL (1,000 mLs Intravenous New Bag/Given 01/10/23 2220)  ondansetron (ZOFRAN) injection 4 mg (4 mg Intravenous Given 01/10/23 2220)  HYDROmorphone (DILAUDID) injection 1 mg (1 mg Intravenous Given 01/10/23 2228)    ED Course/ Medical Decision Making/ A&P   {   Click here for ABCD2, HEART and other calculatorsREFRESH Note before signing :1}  Medical Decision Making Patient here for evaluation of abdominal pain nausea vomiting diarrhea.  Symptoms have been waxing and waning for 2 to 3 weeks worse over the last several days.  She endorses pain of her upper abdomen associated with food intake.  Stools have been loose to watery nonbloody or black.  No reported fever but does admit to chills at home.  Differential would include but not limited to gastroenteritis, SBO, acute appendicitis, colitis, pancreatitis, diverticulitis.  Patient's had prior cholecystectomy.  SBO felt less likely as patient endorses many episodes of diarrhea.  No recent antibiotic use.  Amount and/or Complexity of Data Reviewed Labs: ordered.    Details: Labs interpreted by me, no evidence of leukocytosis, hemoglobin unremarkable.  Pregnancy test negative.  Urinalysis shows cloudy urine with many bacteria and 6-10  white cells negative nitrates negative leukocytes.  Urine culture pending.  Chemistries without significant derangement, lipase unremarkable Radiology: ordered.    Details: CT abdomen pelvis ordered for further evaluation patient's symptoms Discussion of management or test interpretation with external provider(s): On recheck, patient has received IV fluids, pain medication and antiemetic.  Reports feeling better.  Risk Prescription drug management.     {Document critical care time when appropriate:1} {Document review of labs and clinical decision tools ie heart score, Chads2Vasc2 etc:1}  {Document your independent review of radiology images, and any outside records:1} {Document your discussion with family members, caretakers, and with consultants:1} {Document social determinants of health affecting pt's care:1} {Document your decision making why or why not admission, treatments were needed:1} Final Clinical Impression(s) / ED Diagnoses Final diagnoses:  None    Rx / DC Orders ED Discharge Orders     None

## 2023-01-10 NOTE — ED Triage Notes (Signed)
Pt arrived from home via POV c/o abd pain, epigastric 8/10 on pain scale in last 2 days emesis x 8 and diarrhea x 10+.

## 2023-01-10 NOTE — ED Provider Notes (Signed)
[place] EMERGENCY DEPARTMENT AT [facility] Provider Note   CSN: [id number] Arrival date & time: 01/10/23  2020     History  Chief Complaint  Patient presents with   Emesis    Chelsey Jordan is a 47 y.o. female.  The history is provided by the patient and a relative. The history is limited by a language barrier. A language interpreter was used (Relative at bedside).  Emesis Associated symptoms: abdominal pain, chills and diarrhea   Associated symptoms: no fever        Chelsey Jordan is a 47 y.o. female history of HTN who presents to the Emergency Department complaining of intermittent abdominal pain, nausea, vomiting.  Symptoms began 2 to 3 weeks ago, worse x 2 days.  Describes intermittent pain of her upper abdomen.  Pain is nonradiating.  Pain worsens with food intake.  States she typically eats solid food and has vomiting.  Feels like her abdomen is swollen.  She also reports multiple episodes of loose to watery stools, nonbloody or black.  Chills at home.  Did  not take her temperature.  Denies flank pain, dysuria, chest pain or shortness of breath.  History of prior cholecystectomy many years ago  Was taking Ozempic but stopped 3 weeks ago. Not currently taking any anti-diabetic medications    Home Medications Prior to Admission medications   Medication Sig Start Date End Date Taking? Authorizing Provider  OZEMPIC, 0.25 OR 0.5 MG/DOSE, 2 MG/3ML SOPN SMARTSIG:0.25 Milligram(s) SUB-Q Once a Week 12/19/21   [provider]  Vitamin D, Ergocalciferol, (DRISDOL) 1.25 MG (50000 UNIT) CAPS capsule Take 50,000 Units by mouth once a week. 12/09/21   [provider]      Allergies    Patient has no known allergies.    Review of Systems   Review of Systems  Constitutional:  Positive for appetite change and chills. Negative for fever.  Respiratory:  Negative for chest tightness and shortness of breath.   Cardiovascular:  Negative for chest  pain.  Gastrointestinal:  Positive for abdominal pain, diarrhea, nausea and vomiting. Negative for blood in stool.  Genitourinary:  Negative for dysuria and flank pain.  Musculoskeletal:  Negative for back pain.  Skin:  Negative for rash.  Neurological:  Negative for dizziness, syncope, weakness and numbness.    Physical Exam Updated Vital Signs BP (!) 135/95 (BP Location: Right Arm)   Pulse 99   Temp 97.7 F (36.5 C) (Oral)   Resp 15   Ht 5\' 2"  (1.575 m)   Wt 93 kg   SpO2 96%   BMI 37.49 kg/m  Physical Exam Vitals and nursing note reviewed.  Constitutional:      General: She is not in acute distress.    Appearance: Normal appearance. She is not ill-appearing or toxic-appearing.  HENT:     Mouth/Throat:     Mouth: Mucous membranes are moist.     Pharynx: Oropharynx is clear. No oropharyngeal exudate or posterior oropharyngeal erythema.  Cardiovascular:     Rate and Rhythm: Normal rate and regular rhythm.     Pulses: Normal pulses.  Pulmonary:     Effort: Pulmonary effort is normal. No respiratory distress.  Abdominal:     Palpations: Abdomen is soft. There is no mass.     Tenderness: There is abdominal tenderness. There is no right CVA tenderness, left CVA tenderness or guarding.     Comments: Tenderness to palpation of the epigastrium and right lower quadrant.  No  guarding or rebound.  Abdomen is soft  Musculoskeletal:     Right lower leg: No edema.     Left lower leg: No edema.  Skin:    General: Skin is warm.     Capillary Refill: Capillary refill takes less than 2 seconds.  Neurological:     General: No focal deficit present.     Mental Status: She is alert.     Sensory: No sensory deficit.     Motor: No weakness.     ED Results / Procedures / Treatments   Labs (all labs ordered are listed, but only abnormal results are displayed) Labs Reviewed  CBC - Abnormal; Notable for the following components:      Result Value   RBC 5.30 (*)    All other components  within normal limits  URINALYSIS, ROUTINE W REFLEX MICROSCOPIC - Abnormal; Notable for the following components:   APPearance CLOUDY (*)    Protein, ur 30 (*)    Bacteria, UA MANY (*)    All other components within normal limits  COMPREHENSIVE METABOLIC PANEL - Abnormal; Notable for the following components:   Potassium 3.4 (*)    CO2 21 (*)    Glucose, Bld 106 (*)    Calcium 8.2 (*)    All other components within normal limits  LIPASE, BLOOD  POC URINE PREG, ED    EKG None  Radiology CT ABDOMEN PELVIS W CONTRAST  Result Date: 01/10/2023 CLINICAL DATA:  Acute nonlocalized abdominal pain. Epigastric pain. Vomiting and diarrhea for 2 days. EXAM: CT ABDOMEN AND PELVIS WITH CONTRAST TECHNIQUE: Multidetector CT imaging of the abdomen and pelvis was performed using the standard protocol following bolus administration of intravenous contrast. RADIATION DOSE REDUCTION: This exam was performed according to the departmental dose-optimization program which includes automated exposure control, adjustment of the mA and/or kV according to patient size and/or use of iterative reconstruction technique. CONTRAST:  OMNIPAQUE IOHEXOL 300 MG/ML  SOLN COMPARISON:  None Available. FINDINGS: Lower chest: Dependent atelectasis in the lung bases. Hepatobiliary: No focal liver abnormality is seen. Status post cholecystectomy. No biliary dilatation. Pancreas: Unremarkable. No pancreatic ductal dilatation or surrounding inflammatory changes. Spleen: Normal in size without focal abnormality. Adrenals/Urinary Tract: Adrenal glands are unremarkable. Kidneys are normal, without renal calculi, focal lesion, or hydronephrosis. Bladder is unremarkable. Stomach/Bowel: Stomach, small bowel, and colon are not abnormally distended. Fluid-filled colon consistent with liquid stool. Wall thickening in the rectosigmoid colon suggesting evidence of colitis. No evidence of acute diverticulitis. Appendix is normal.  Vascular/Lymphatic: No significant vascular findings are present. No enlarged abdominal or pelvic lymph nodes. Reproductive: Uterus and bilateral adnexa are unremarkable. Other: No abdominal wall hernia or abnormality. No abdominopelvic ascites. Musculoskeletal: No acute or significant osseous findings. IMPRESSION: 1. Liquid stool in the colon with wall thickening in the rectosigmoid region suggesting colitis consistent with history of diarrhea. 2. No evidence of bowel obstruction. Electronically Signed   By: Burman Nieves M.D.   On: 01/10/2023 23:50    Procedures Procedures    Medications Ordered in ED Medications  sodium chloride 0.9 % bolus 1,000 mL (1,000 mLs Intravenous New Bag/Given 01/10/23 2220)  ondansetron (ZOFRAN) injection 4 mg (4 mg Intravenous Given 01/10/23 2220)  HYDROmorphone (DILAUDID) injection 1 mg (1 mg Intravenous Given 01/10/23 2228)    ED Course/ Medical Decision Making/ A&P  Medical Decision Making Patient here for evaluation of abdominal pain nausea vomiting diarrhea.  Symptoms have been waxing and waning for 2 to 3 weeks worse over the last several days.  She endorses pain of her upper abdomen associated with food intake.  Stools have been loose to watery nonbloody or black.  No reported fever but does admit to chills at home.  Differential would include but not limited to gastroenteritis, SBO, acute appendicitis, colitis, pancreatitis, diverticulitis.  Patient's had prior cholecystectomy.  SBO felt less likely as patient endorses many episodes of diarrhea.  No recent antibiotic use.  Amount and/or Complexity of Data Reviewed Labs: ordered.    Details: Labs interpreted by me, no evidence of leukocytosis, hemoglobin unremarkable.  Pregnancy test negative.  Urinalysis shows cloudy urine with many bacteria and 6-10 white cells negative nitrates negative leukocytes.  Urine culture pending.  Chemistries without significant derangement, lipase  unremarkable Radiology: ordered.    Details: CT abdomen pelvis ordered for further evaluation patient's symptoms Discussion of management or test interpretation with external provider(s): On recheck, patient has received IV fluids, pain medication and antiemetic.  Reports feeling better.  Risk Prescription drug management.           Final Clinical Impression(s) / ED Diagnoses Final diagnoses:  Colitis    Rx / DC Orders ED Discharge Orders     None         Pauline Aus, PA-C 01/11/23 0019    Lonell Grandchild, MD 01/11/23 1310

## 2023-01-10 NOTE — ED Notes (Signed)
Pt in CT. Kellogg RN

## 2023-01-10 NOTE — ED Notes (Signed)
Spanish interpreter utilized. PT ambulated to restroom with steady gait. Chelsey Jordan

## 2023-01-10 NOTE — ED Notes (Signed)
Phlebotomy at bedside due lab hemolysis. Kellogg RN

## 2023-01-11 MED ORDER — AMOXICILLIN-POT CLAVULANATE 875-125 MG PO TABS: 1.0000 | ORAL_TABLET | Freq: Two times a day (BID) | ORAL | 0 refills | Status: DC

## 2023-01-11 MED ORDER — METRONIDAZOLE 500 MG PO TABS: 500.0000 mg | ORAL_TABLET | Freq: Three times a day (TID) | ORAL | 0 refills | Status: DC

## 2023-01-11 MED ORDER — CIPROFLOXACIN HCL 500 MG PO TABS: 500.0000 mg | ORAL_TABLET | Freq: Two times a day (BID) | ORAL | 0 refills | Status: DC

## 2023-01-11 MED ORDER — ONDANSETRON HCL 4 MG PO TABS: 4.0000 mg | ORAL_TABLET | Freq: Four times a day (QID) | ORAL | 0 refills | Status: DC

## 2023-01-11 MED ORDER — METRONIDAZOLE 500 MG PO TABS
500.0000 mg | ORAL_TABLET | Freq: Once | ORAL | Status: AC
  Administered 2023-01-11: 500 mg via ORAL
  Filled 2023-01-11: qty 1

## 2023-01-11 MED ORDER — AMOXICILLIN-POT CLAVULANATE 875-125 MG PO TABS
1.0000 | ORAL_TABLET | Freq: Once | ORAL | Status: DC
  Filled 2023-01-11: qty 1

## 2023-01-11 MED ORDER — CIPROFLOXACIN HCL 250 MG PO TABS
500.0000 mg | ORAL_TABLET | Freq: Once | ORAL | Status: AC
  Administered 2023-01-11: 500 mg via ORAL
  Filled 2023-01-11: qty 2

## 2023-01-11 NOTE — Discharge Instructions (Signed)
Bland diet as tolerated, eat smaller more frequent meals.  Take the antibiotic as directed until finished.  You have also been prescribed medications for nausea and vomiting.  Please follow-up with your primary care provider early next week for recheck.  I have also listed a gastroenterologist that you may contact to arrange follow-up if needed.  Return emergency department for any new or worsening symptoms.

## 2023-01-11 NOTE — ED Notes (Signed)
Spanish interpreter used to review DC papers with patient and husband.  B Rosio Weiss RN

## 2023-01-12 LAB — URINE CULTURE: Culture: <10000 — AB

## 2023-01-15 NOTE — Progress Notes (Unsigned)
Referring Provider:[facility] Primary Care Physician:  Vania Rea, FNP Primary Gastroenterologist:  Dr. Marletta Lor  Chief Complaint  Patient presents with   Follow-up    ED follow up. Was told she had colitis. Still having some diarrhea.     HPI:   Chelsey Jordan is a 47 y.o. female presenting today at the request of [name] for colitis.   Patient was seen in the emergency room 01/10/2023 for abdominal pain, nausea, vomiting, diarrhea x 2 to 3 weeks, but worse over the last 2 days.  No leukocytosis.  Potassium slightly low at 3.4.  LFTs normal.  Lipase normal.  CT A/P with contrast with liquid stool in the colon with wall thickening in the rectosigmoid region suggesting colitis.  She was prescribed Cipro and Flagyl x 10 days.  Today:  Visit completed with [facility] Interpreter in person.   Had cramping upper abdominal pain, diarrhea, nausea, vomiting, headache, and fever. Started 3 weeks prior to being seen in the ER. Was intermittent. Come and go. Became more severe/persistent and went to the ER. Prior to this she has had issues with diarrhea with black stools. Started the week of Thanksgiving 2023. Was about once a week. Prior to that, she had intermittent diarrhea since 2004 when she had gallbladder surgery, but it was nothing that was a frequent problem.    Feeling better since being on antibiotics. Having 2 Bms per day. Stools are mushy. Was having more than 8 Bms per day. No further black stool. Little sporadic nausea. No vomiting. Eating soft foods. Not eating much as she is nervous about her symptoms. Little pain. Has tenderness in epigastric and RUQ region. Not worsened by meals. No GERD symptoms on omeprazole 40 mg daily, but feels foods get stuck in her esophagus. Only with solid foods.   She has noticed joint pain since all this happened. She has also had intermittent dizziness with position changes or turning her head.    Weight loss: None  NSAIDs:  None Prior Antibiotics: None Prior Travel: None Prior Sick Contacts: None  Stopped Ozempic when abdominal pain started. Buck Mam been on ozempic for about 2 months maybe.   Fhx colon cancer or IBD:  Father with Crohns and a lot of years of "colitis".  No colon cancer.   Prior colonoscopy: July 2023. Dr. Gwendalyn Ege at [facility] in [place]. Polyps removed.   Past Medical History:  Diagnosis Date   Hypertension    Vaginal Pap smear, abnormal     Past Surgical History:  Procedure Laterality Date   BREAST BIOPSY     CESAREAN SECTION     x2   GALLBLADDER SURGERY     TUBAL LIGATION      Current Outpatient Medications  Medication Sig Dispense Refill   ciprofloxacin (CIPRO) 500 MG tablet Take 1 tablet (500 mg total) by mouth 2 (two) times daily. 20 tablet 0   metroNIDAZOLE (FLAGYL) 500 MG tablet Take 1 tablet (500 mg total) by mouth 3 (three) times daily. 30 tablet 0   omeprazole (PRILOSEC) 40 MG capsule Take 40 mg by mouth daily.     ondansetron (ZOFRAN) 4 MG tablet Take 1 tablet (4 mg total) by mouth every 6 (six) hours. As needed for nausea vomiting 15 tablet 0   RESTASIS 0.05 % ophthalmic emulsion 1 drop 2 (two) times daily.     Vitamin D, Ergocalciferol, (DRISDOL) 1.25 MG (50000 UNIT) CAPS capsule Take 50,000 Units by mouth once a  week.     OZEMPIC, 0.25 OR 0.5 MG/DOSE, 2 MG/3ML SOPN SMARTSIG:0.25 Milligram(s) SUB-Q Once a Week (Patient not taking: Reported on 01/17/2023)     polyethylene glycol-electrolytes (NULYTELY) 420 g solution Take 4,000 mLs by mouth once for 1 dose. 4000 mL 0   No current facility-administered medications for this visit.    Allergies as of 01/17/2023   (No Known Allergies)    Family History  Problem Relation Age of Onset   Hypertension Mother    Lung disease Mother    Lupus Mother    Hypertension Father    Heart disease Father    Crohn's disease Father    Cancer - Colon Neg Hx     Social History   Socioeconomic History   Marital status:  Single    Spouse name: Not on file   Number of children: Not on file   Years of education: Not on file   Highest education level: Not on file  Occupational History   Not on file  Tobacco Use   Smoking status: Former   Smokeless tobacco: Never  Vaping Use   Vaping Use: Never used  Substance and Sexual Activity   Alcohol use: No   Drug use: No   Sexual activity: Not Currently    Partners: Male    Birth control/protection: Surgical  Other Topics Concern   Not on file  Social History Narrative   Not on file   Social Determinants of Health   Financial Resource Strain: Not on file  Food Insecurity: Not on file  Transportation Needs: Not on file  Physical Activity: Not on file  Stress: Not on file  Social Connections: Not on file  Intimate Partner Violence: Not on file    Review of Systems: Gen: Denies any fever, chills, cold or flu like symptoms, pre-syncope, or syncope.  CV: Denies chest pain, heart palpitations. Resp: Denies shortness of breath, cough.   GI: See HPI GU : Denies urinary burning, urinary frequency, urinary hesitancy MS: Admits to joint pain.  Derm: Denies rash. Psych: Denies depression, anxiety. Heme: See HPI  Physical Exam: BP 126/83 (BP Location: Right Arm, Patient Position: Sitting, Cuff Size: Normal)   Pulse 79   Temp 98.1 F (36.7 C) (Temporal)   Ht 5\' 2"  (1.575 m)   Wt 206 lb 6.4 oz (93.6 kg)   SpO2 96%   BMI 37.75 kg/m  General:   Alert and oriented. Pleasant and cooperative. Well-nourished and well-developed.  Head:  Normocephalic and atraumatic. Eyes:  Without icterus, sclera clear and conjunctiva pink.  Ears:  Normal auditory acuity. Lungs:  Clear to auscultation bilaterally. No wheezes, rales, or rhonchi. No distress.  Heart:  S1, S2 present without murmurs appreciated.  Abdomen:  +BS, soft, and non-distended. Moderate TTP in epigastric and RUQ region, mild TTP in RLQ and suprapubic area. No HSM noted. No guarding or rebound. No  masses appreciated.  Rectal:  Deferred  Msk:  Symmetrical without gross deformities. Normal posture. Extremities:  Without edema. Neurologic:  Alert and  oriented x4;  grossly normal neurologically. Skin:  Intact without significant lesions or rashes. Psych:  Normal mood and affect.    Assessment:  47 y.o. female with history of hypertension, presenting today at the request of [name] for colitis.   Colitis  Patient reports intermittent black diarrhea starting Thanksgiving 2023, then developing intermittent upper abdominal pain, nausea, vomiting as well in March with worsening diarrhea. Evaluated in the ED 4/24 and found to have  colon with wall thickening in the rectosigmoid region suggesting colitis on CT A/P with contrast. Labs reassuring. She did have mild hypokalemia. Started on cipro and flagyl (currently on day 7) and has had significant clinical improvement. Now with 2 mushy Bms daily. No further black stools.  Abdominal pain improved. Continues with epigastric and right upper quadrant tenderness to palpation, but denies any worsening with meals.  She has noticed some joint pain since GI symptoms started. Prior to onset of GI symptoms around Thanksgiving, patient reports occasional diarrhea since having her gallbladder removed 2004.  Father with history of Crohn's disease.  No family history of colon cancer.  Patient reports colonoscopy in July of last year in [facility] with polyps removed.  As patient is improving with antibiotics, favor infections colitis, but unable to rule out IBD. Recommend colonoscopy in about 6 weeks. Unclear if upper abdominal pain and prior black stool is related to colitis vs gastris, duodenitis, PUD. She denies NSAIDs and is on omeprazole daily. I have recommended EGD at the time of colonoscopy.   Dysphagia:  New onset solid food dysphagia recently. Chronic GERD well controlled on omeprazole 40 mg daily. Needs EGD for furhter evaluation and therapeutic  intervention. Differentials include esophageal web, ring, stricture, EOE, less likely malignancy.   Plan:  Proceed with upper endoscopy +/- dilation + colonoscopy with propofol by Dr. Marletta Lor in about 6 weeks The risks, benefits, and alternatives have been discussed with the patient in detail. The patient states understanding and desires to proceed.  ASA 2 UTP prior BMP Complete course of antibiotics. Continue omeprazole 40 mg daily. Okay to start slowly advancing diet back to normal.  Continue to avoid spicy and greasy foods, limit dairy products. Monitor for worsening abdominal pain or worsening diarrhea.  Patient will let me know if this occurs. Dysphagia precautions discussed.  Written instructions provided on AVS. Follow-up after procedures.   Ermalinda Memos, PA-C [facility] 01/17/2023

## 2023-01-17 ENCOUNTER — Other Ambulatory Visit: Payer: Self-pay | Admitting: *Deleted

## 2023-01-17 ENCOUNTER — Encounter: Payer: Self-pay | Admitting: Gastroenterology

## 2023-01-17 ENCOUNTER — Encounter: Payer: Self-pay | Admitting: *Deleted

## 2023-01-17 ENCOUNTER — Telehealth: Payer: Self-pay | Admitting: *Deleted

## 2023-01-17 ENCOUNTER — Ambulatory Visit (INDEPENDENT_AMBULATORY_CARE_PROVIDER_SITE_OTHER): Payer: Medicaid Other | Admitting: Gastroenterology

## 2023-01-17 VITALS — BP 126/83 | HR 79 | Temp 98.1°F | Ht 62.0 in | Wt 206.4 lb

## 2023-01-17 DIAGNOSIS — R1310 Dysphagia, unspecified: Secondary | ICD-10-CM | POA: Diagnosis not present

## 2023-01-17 DIAGNOSIS — K529 Noninfective gastroenteritis and colitis, unspecified: Secondary | ICD-10-CM | POA: Diagnosis not present

## 2023-01-17 DIAGNOSIS — R1010 Upper abdominal pain, unspecified: Secondary | ICD-10-CM | POA: Diagnosis not present

## 2023-01-17 MED ORDER — PEG 3350-KCL-NA BICARB-NACL 420 G PO SOLR: 4000.0000 mL | Freq: Once | ORAL | 0 refills | Status: AC

## 2023-01-17 NOTE — Patient Instructions (Addendum)
English instructions: We will arrange for her to have an upper endoscopy with possible stretching of your esophagus and colonoscopy in the near future with Dr. Marletta Lor. If Ozempic is resumed, you will need to let us know as we have to adjust this medication before your procedures.  Please have blood work completed at [facility]  Complete your course of antibiotics as prescribed by the emergency room.  Continue omeprazole 40 mg daily.  You can go ahead and start slowly advancing your diet back to normal.  Continue to avoid spicy, fatty foods. Limit dairy products for now as they may cause some increased diarrhea for you.  Monitor for recurrent abdominal pain or worsening diarrhea, and let me know if this occurs.  Swallowing precautions:  Eat slowly, take small bites, chew thoroughly, drink plenty of liquids throughout meals.  Avoid trough textures All meats should be chopped finely.  If something gets hung in your esophagus and will not come up or go down, proceed to the emergency room.     Will plan to see you back after your procedures.  Do not hesitate to call sooner if you have questions or concerns.  It was very nice to meet you today!  Spanish instructions: Haremos arreglos para que se le haga una endoscopia superior con posible estiramiento del esfago y [place]om[place] Dear colonoscopia en un futuro cercano con el Dr. Marletta Lor. Si se reanuda el tratamiento con Ozempic, deber informarnos, ya que tenemos que ajustar este medicamento antes de sus procedimientos.  Por favor, hgase un anlisis de sangre en [facility]  Complete su ciclo de antibiticos segn lo prescrito por la sala de emergencias.  Contine con omeprazol 40 mg al da.  Puede seguir adelante y comenzar a Armed forces technical officer en su dieta para que vuelva a la normalidad.  Contine evitando los alimentos picantes y grasos. Limite los productos lcteos por ahora, ya que pueden causarle un aumento de la [place].  Vigile si hay dolor  abdominal recurrente o empeoramiento de la diarrea, y avseme si esto ocurre.  Precauciones para tragar:  Coma despacio, tome pequeos bocados, mastique bien, beba muchos lquidos [place] [employer].  Evite las texturas de canal Todas las carnes deben picarse finamente.  Si algo se cuelga en el esfago y no sube ni baja, dirjase a la sala de emergencias.    Planear verlo de regreso despus de sus procedimientos.  No dude en llamar antes si tiene preguntas o inquietudes.  Ha sido un placer conocerte hoy!   Ermalinda Memos, PA-C [facility]

## 2023-01-17 NOTE — Addendum Note (Signed)
Addended by: Elinor Dodge on: 01/17/2023 12:52 PM   Modules accepted: Orders

## 2023-01-17 NOTE — Telephone Encounter (Signed)
[employer] PA: Pending, Ref# [id number]

## 2023-01-18 NOTE — Telephone Encounter (Signed)
[employer] PA: Approved # [id number] DOS: 02/20/23-05/21/23

## 2023-01-23 LAB — BASIC METABOLIC PANEL
BUN/Creatinine Ratio: 16 (ref 9–23)
BUN: 12 mg/dL (ref 6–24)
CO2: 20 mmol/L (ref 20–29)
Calcium: 9.7 mg/dL (ref 8.7–10.2)
Chloride: 107 mmol/L — ABNORMAL HIGH (ref 96–106)
Creatinine, Ser: 0.77 mg/dL (ref 0.57–1.00)
Glucose: 107 mg/dL — ABNORMAL HIGH (ref 70–99)
Potassium: 4.3 mmol/L (ref 3.5–5.2)
Sodium: 142 mmol/L (ref 134–144)
eGFR: 95 mL/min/{1.73_m2} (ref >59)

## 2023-02-13 ENCOUNTER — Telehealth: Payer: Self-pay | Admitting: Gastroenterology

## 2023-02-13 NOTE — Telephone Encounter (Signed)
Received colonoscopy report from [facility] dated 04/05/2021, performed by Dr. Gwendalyn Ege.  Impression: 1.  Single polyp was found in the ascending colon; polypectomy was performed with a cold snare 2.  Single polyp was found in the transverse colon; polypectomy was performed with a cold snare 3.  Single polyp was found in the sigmoid colon; polypectomy was performed with cold forceps. 4.  Single polyp was found in the rectum; polypectomy was performed with cold forceps 5.  Single polyp was found in the rectum; polypectomy was performed with cold forceps 6.  Mild diverticulosis was noted in the sigmoid colon.  Pathology: 3 tubular adenomas, 2 hyperplastic polyps.  Recommended 3-year surveillance.   No additional recommendations at this time.  We will proceed with colonoscopy as planned to follow-up on recent colitis.

## 2023-02-16 ENCOUNTER — Other Ambulatory Visit (HOSPITAL_COMMUNITY)
Admission: RE | Admit: 2023-02-16 | Discharge: 2023-02-16 | Disposition: A | Discharge status: Home / Self Care | Payer: Medicaid Other | Source: Ambulatory Visit | Attending: Internal Medicine | Admitting: Internal Medicine

## 2023-02-16 DIAGNOSIS — R1010 Upper abdominal pain, unspecified: Secondary | ICD-10-CM | POA: Insufficient documentation

## 2023-02-16 DIAGNOSIS — R1310 Dysphagia, unspecified: Secondary | ICD-10-CM | POA: Diagnosis present

## 2023-02-16 DIAGNOSIS — K529 Noninfective gastroenteritis and colitis, unspecified: Secondary | ICD-10-CM | POA: Diagnosis present

## 2023-02-16 LAB — PREGNANCY, URINE: Preg Test, Ur: NEGATIVE

## 2023-02-20 ENCOUNTER — Ambulatory Visit (HOSPITAL_COMMUNITY): Payer: Medicaid Other | Admitting: Certified Registered"

## 2023-02-20 ENCOUNTER — Ambulatory Visit (HOSPITAL_BASED_OUTPATIENT_CLINIC_OR_DEPARTMENT_OTHER): Payer: Medicaid Other | Admitting: Certified Registered"

## 2023-02-20 ENCOUNTER — Other Ambulatory Visit: Payer: Self-pay

## 2023-02-20 ENCOUNTER — Encounter (HOSPITAL_COMMUNITY): Payer: Self-pay

## 2023-02-20 ENCOUNTER — Ambulatory Visit (HOSPITAL_COMMUNITY)
Admission: RE | Admit: 2023-02-20 | Discharge: 2023-02-20 | Disposition: A | Discharge status: Home / Self Care | Payer: Medicaid Other | Attending: Internal Medicine | Admitting: Internal Medicine

## 2023-02-20 ENCOUNTER — Encounter (HOSPITAL_COMMUNITY)
Admission: RE | Disposition: A | Discharge status: Home / Self Care | Payer: Self-pay | Source: Home / Self Care | Attending: Internal Medicine

## 2023-02-20 DIAGNOSIS — K319 Disease of stomach and duodenum, unspecified: Secondary | ICD-10-CM | POA: Insufficient documentation

## 2023-02-20 DIAGNOSIS — K648 Other hemorrhoids: Secondary | ICD-10-CM | POA: Insufficient documentation

## 2023-02-20 DIAGNOSIS — K219 Gastro-esophageal reflux disease without esophagitis: Secondary | ICD-10-CM | POA: Insufficient documentation

## 2023-02-20 DIAGNOSIS — Z8249 Family history of ischemic heart disease and other diseases of the circulatory system: Secondary | ICD-10-CM | POA: Insufficient documentation

## 2023-02-20 DIAGNOSIS — I10 Essential (primary) hypertension: Secondary | ICD-10-CM | POA: Diagnosis not present

## 2023-02-20 DIAGNOSIS — Z6838 Body mass index (BMI) 38.0-38.9, adult: Secondary | ICD-10-CM | POA: Insufficient documentation

## 2023-02-20 DIAGNOSIS — K2990 Gastroduodenitis, unspecified, without bleeding: Secondary | ICD-10-CM

## 2023-02-20 DIAGNOSIS — K2970 Gastritis, unspecified, without bleeding: Secondary | ICD-10-CM | POA: Insufficient documentation

## 2023-02-20 DIAGNOSIS — K529 Noninfective gastroenteritis and colitis, unspecified: Secondary | ICD-10-CM | POA: Diagnosis not present

## 2023-02-20 DIAGNOSIS — D122 Benign neoplasm of ascending colon: Secondary | ICD-10-CM | POA: Insufficient documentation

## 2023-02-20 DIAGNOSIS — Z8379 Family history of other diseases of the digestive system: Secondary | ICD-10-CM | POA: Diagnosis not present

## 2023-02-20 DIAGNOSIS — R1310 Dysphagia, unspecified: Secondary | ICD-10-CM | POA: Diagnosis present

## 2023-02-20 DIAGNOSIS — D125 Benign neoplasm of sigmoid colon: Secondary | ICD-10-CM | POA: Diagnosis not present

## 2023-02-20 DIAGNOSIS — D126 Benign neoplasm of colon, unspecified: Secondary | ICD-10-CM | POA: Diagnosis not present

## 2023-02-20 DIAGNOSIS — K2980 Duodenitis without bleeding: Secondary | ICD-10-CM | POA: Diagnosis not present

## 2023-02-20 DIAGNOSIS — R933 Abnormal findings on diagnostic imaging of other parts of digestive tract: Secondary | ICD-10-CM | POA: Diagnosis present

## 2023-02-20 HISTORY — PX: BIOPSY: SHX5522

## 2023-02-20 HISTORY — PX: COLONOSCOPY WITH PROPOFOL: SHX5780

## 2023-02-20 HISTORY — PX: ESOPHAGOGASTRODUODENOSCOPY (EGD) WITH PROPOFOL: SHX5813

## 2023-02-20 SURGERY — COLONOSCOPY WITH PROPOFOL
Anesthesia: General

## 2023-02-20 MED ORDER — OMEPRAZOLE 40 MG PO CPDR: 40.0000 mg | DELAYED_RELEASE_CAPSULE | Freq: Two times a day (BID) | ORAL | 11 refills | Status: DC

## 2023-02-20 MED ORDER — LACTATED RINGERS IV SOLN: INTRAVENOUS | Status: DC | PRN

## 2023-02-20 MED ORDER — LIDOCAINE HCL (CARDIAC) PF 100 MG/5ML IV SOSY
PREFILLED_SYRINGE | INTRAVENOUS | Status: DC | PRN
  Administered 2023-02-20: 50 mg via INTRAVENOUS

## 2023-02-20 MED ORDER — PROPOFOL 10 MG/ML IV BOLUS
INTRAVENOUS | Status: DC | PRN
  Administered 2023-02-20 (×3): 50 mg via INTRAVENOUS
  Administered 2023-02-20: 30 mg via INTRAVENOUS
  Administered 2023-02-20: 40 mg via INTRAVENOUS
  Administered 2023-02-20: 50 mg via INTRAVENOUS
  Administered 2023-02-20: 130 mg via INTRAVENOUS

## 2023-02-20 NOTE — Discharge Instructions (Addendum)
Su endoscopia revel inflamacin en el estmago y el intestino delgado.  Me hicieron biopsias para descartar una infeccin por una bacteria llamada H. pylori.  Llamaremos con [employer].  Voy a aumentar su omeprazol a dos veces al [employer] prximas 12 semanas.  Tu esfago estaba abierto, no necesit estirarlo hoy.  Su colonoscopia revel 2 plipos que elimin con xito.  Llamaremos con [employer].  Recomendar repetir colonoscopia en 5 aos.  Tu colitis ha sanado.  Seguimiento en consultorio GI en 2-3 meses.

## 2023-02-20 NOTE — Anesthesia Preprocedure Evaluation (Signed)
Anesthesia Evaluation  Patient identified by MRN, date of birth, ID band Patient awake    Reviewed: Allergy & Precautions, H&P , NPO status , Patient's Chart, lab work & pertinent test results, reviewed documented beta blocker date and time   Airway Mallampati: II  TM Distance: >3 FB Neck ROM: full    Dental no notable dental hx.    Pulmonary neg pulmonary ROS   Pulmonary exam normal breath sounds clear to auscultation       Cardiovascular Exercise Tolerance: Good hypertension, negative cardio ROS  Rhythm:regular Rate:Normal     Neuro/Psych negative neurological ROS  negative psych ROS   GI/Hepatic negative GI ROS, Neg liver ROS,,,  Endo/Other    Morbid obesity  Renal/GU negative Renal ROS  negative genitourinary   Musculoskeletal   Abdominal   Peds  Hematology negative hematology ROS (+)   Anesthesia Other Findings   Reproductive/Obstetrics negative OB ROS                             Anesthesia Physical Anesthesia Plan  ASA: 3  Anesthesia Plan: General   Post-op Pain Management:    Induction:   PONV Risk Score and Plan: Propofol infusion  Airway Management Planned:   Additional Equipment:   Intra-op Plan:   Post-operative Plan:   Informed Consent: I have reviewed the patients History and Physical, chart, labs and discussed the procedure including the risks, benefits and alternatives for the proposed anesthesia with the patient or authorized representative who has indicated his/her understanding and acceptance.     Dental Advisory Given  Plan Discussed with: CRNA  Anesthesia Plan Comments:        Anesthesia Quick Evaluation  

## 2023-02-20 NOTE — Anesthesia Procedure Notes (Signed)
Date/Time: 02/20/2023 11:56 AM  Performed by: Julian Reil, CRNAPre-anesthesia Checklist: Patient identified, Emergency Drugs available, Patient being monitored and Suction available Patient Re-evaluated:Patient Re-evaluated prior to induction Oxygen Delivery Method: Nasal cannula Induction Type: IV induction Placement Confirmation: positive ETCO2

## 2023-02-20 NOTE — Transfer of Care (Signed)
Immediate Anesthesia Transfer of Care Note  Patient: Chelsey Jordan  Procedure(s) Performed: COLONOSCOPY WITH PROPOFOL ESOPHAGOGASTRODUODENOSCOPY (EGD) WITH PROPOFOL BIOPSY  Patient Location: Endoscopy Unit  Anesthesia Type:General  Level of Consciousness: awake  Airway & Oxygen Therapy: Patient Spontanous Breathing  Post-op Assessment: Report given to RN and Post -op Vital signs reviewed and stable  Post vital signs: Reviewed and stable  Last Vitals:  Vitals Value Taken Time  BP    Temp    Pulse    Resp    SpO2      Last Pain:  Vitals:   02/20/23 1128  TempSrc: Oral  PainSc:       Patients Stated Pain Goal: 7 (02/20/23 1115)  Complications: No notable events documented.

## 2023-02-20 NOTE — Op Note (Signed)
[facility] Patient Name: Chelsey Jordan Procedure Date: 02/20/2023 11:35 AM MRN: [id number] Date of Birth: [birth year] Attending MD: Hennie Duos. Marletta Lor , [place], [id number] CSN: [id number] Age: 47 Admit Type: Outpatient Procedure:                Upper GI endoscopy Indications:              Dysphagia, Heartburn Providers:                Hennie Duos. Marletta Lor, DO, Jannett Celestine, RN, Zena Amos Referring MD:              Medicines:                See the Anesthesia note for documentation of the                            administered medications Complications:            No immediate complications. Estimated Blood Loss:     Estimated blood loss was minimal. Procedure:                Pre-Anesthesia Assessment:                           - The anesthesia plan was to use monitored                            anesthesia care (MAC).                           After obtaining informed consent, the endoscope was                            passed under direct vision. Throughout the                            procedure, the patient's blood pressure, pulse, and                            oxygen saturations were monitored continuously. The                            GIF-H190 ([id number]) scope was introduced through the                            mouth, and advanced to the second part of duodenum.                            The upper GI endoscopy was accomplished without                            difficulty. The patient tolerated the procedure                            well. Scope In: 11:49:39 AM  Scope Out: 11:53:24 AM Total Procedure Duration: 0 hours 3 minutes 45 seconds  Findings:      There is no endoscopic evidence of stenosis or stricture in the entire       esophagus.      The Z-line was regular and was found 36 cm from the incisors.      Patchy moderate inflammation characterized by erosions, erythema and       shallow ulcerations was found in the gastric  body and in the gastric       antrum. Biopsies were taken with a cold forceps for Helicobacter pylori       testing.      Localized mild inflammation characterized by congestion (edema) and       erosions was found in the duodenal bulb. Biopsies were taken with a cold       forceps for histology. Impression:               - Z-line regular, 36 cm from the incisors.                           - Gastritis. Biopsied.                           - Duodenitis. Biopsied. Moderate Sedation:      Per Anesthesia Care Recommendation:           - Patient has a contact number available for                            emergencies. The signs and symptoms of potential                            delayed complications were discussed with the                            patient. Return to normal activities tomorrow.                            Written discharge instructions were provided to the                            patient.                           - Resume previous diet.                           - Continue present medications.                           - Await pathology results.                           - Use a proton pump inhibitor PO BID for 12 weeks.                           - No ibuprofen, naproxen, or other non-steroidal  anti-inflammatory drugs.                           - Return to GI clinic in 3 months. Procedure Code(s):        --- Professional ---                           [phone], Esophagogastroduodenoscopy, flexible,                            transoral; with biopsy, single or multiple Diagnosis Code(s):        --- Professional ---                           K29.70, Gastritis, unspecified, without bleeding                           K29.80, Duodenitis without bleeding                           R13.10, Dysphagia, unspecified                           R12, Heartburn CPT copyright 2022 American Medical Association. All rights reserved. The codes documented in this  report are preliminary and upon coder review may  be revised to meet current compliance requirements. Hennie Duos. Marletta Lor, DO Hennie Duos. Marletta Lor, DO 02/20/2023 11:57:32 AM This report has been signed electronically. Number of Addenda: 0

## 2023-02-20 NOTE — H&P (Signed)
Primary Care Physician:  Vania Rea, FNP Primary Gastroenterologist:  Dr. Marletta Lor  Pre-Procedure History & Physical: HPI:  Chelsey Jordan is a 47 y.o. female is here for an EGD to be performed for GERD/dysphagia and colonoscopy due to history of colitis.   Past Medical History:  Diagnosis Date   Hypertension    Vaginal Pap smear, abnormal     Past Surgical History:  Procedure Laterality Date   BREAST BIOPSY     CESAREAN SECTION     x2   COLONOSCOPY     GALLBLADDER SURGERY     TUBAL LIGATION      Prior to Admission medications   Medication Sig Start Date End Date Taking? Authorizing Provider  RESTASIS 0.05 % ophthalmic emulsion Place 1 drop into both eyes 2 (two) times daily. 07/27/22  Yes [provider]  Vitamin D, Ergocalciferol, (DRISDOL) 1.25 MG (50000 UNIT) CAPS capsule Take 50,000 Units by mouth every Wednesday. 12/09/21  Yes [provider]  ciprofloxacin (CIPRO) 500 MG tablet Take 1 tablet (500 mg total) by mouth 2 (two) times daily. Patient not taking: Reported on 02/16/2023 01/11/23   Gilda Crease, MD  metroNIDAZOLE (FLAGYL) 500 MG tablet Take 1 tablet (500 mg total) by mouth 3 (three) times daily. Patient not taking: Reported on 02/16/2023 01/11/23   Gilda Crease, MD  omeprazole (PRILOSEC) 40 MG capsule Take 40 mg by mouth daily as needed (indigestion/heartburn/stomach aches.).    [provider]  ondansetron (ZOFRAN) 4 MG tablet Take 1 tablet (4 mg total) by mouth every 6 (six) hours. As needed for nausea vomiting 01/11/23   Triplett, Tammy, PA-C  OZEMPIC, 0.25 OR 0.5 MG/DOSE, 2 MG/3ML SOPN Inject 0.25 mg into the skin every Monday. 12/19/21   [provider]  polyethylene glycol-electrolytes (NULYTELY) 420 g solution Take 4,000 mLs by mouth once. 01/17/23   [provider]    Allergies as of 01/17/2023   (No Known Allergies)    Family History  Problem Relation Age of Onset   Hypertension Mother     Lung disease Mother    Lupus Mother    Hypertension Father    Heart disease Father    Crohn's disease Father    Cancer - Colon Neg Hx     Social History   Socioeconomic History   Marital status: Single    Spouse name: Not on file   Number of children: Not on file   Years of education: Not on file   Highest education level: Not on file  Occupational History   Not on file  Tobacco Use   Smoking status: Never   Smokeless tobacco: Never  Vaping Use   Vaping Use: Never used  Substance and Sexual Activity   Alcohol use: No   Drug use: No   Sexual activity: Not Currently    Partners: Male    Birth control/protection: Surgical  Other Topics Concern   Not on file  Social History Narrative   Not on file   Social Determinants of Health   Financial Resource Strain: Not on file  Food Insecurity: Not on file  Transportation Needs: Not on file  Physical Activity: Not on file  Stress: Not on file  Social Connections: Not on file  Intimate Partner Violence: Not on file    Review of Systems: General: Negative for fever, chills, fatigue, weakness. Eyes: Negative for vision changes.  ENT: Negative for hoarseness, difficulty swallowing , nasal congestion. CV: Negative for chest pain, angina,  palpitations, dyspnea on exertion, peripheral edema.  Respiratory: Negative for dyspnea at rest, dyspnea on exertion, cough, sputum, wheezing.  GI: See history of present illness. GU:  Negative for dysuria, hematuria, urinary incontinence, urinary frequency, nocturnal urination.  MS: Negative for joint pain, low back pain.  Derm: Negative for rash or itching.  Neuro: Negative for weakness, abnormal sensation, seizure, frequent headaches, memory loss, confusion.  Psych: Negative for anxiety, depression Endo: Negative for unusual weight change.  Heme: Negative for bruising or bleeding. Allergy: Negative for rash or hives.  Physical Exam: Vital signs in last 24 hours: Temp:  [98.8 F  (37.1 C)] 98.8 F (37.1 C) (06/04 1128) Pulse Rate:  [72] 72 (06/04 1128) Resp:  [23] 23 (06/04 1128) BP: (127)/(83) 127/83 (06/04 1128) SpO2:  [95 %] 95 % (06/04 1128) Weight:  [93 kg] 93 kg (06/04 1115)   General:   Alert,  Well-developed, well-nourished, pleasant and cooperative in NAD Head:  Normocephalic and atraumatic. Eyes:  Sclera clear, no icterus.   Conjunctiva pink. Ears:  Normal auditory acuity. Nose:  No deformity, discharge,  or lesions. Msk:  Symmetrical without gross deformities. Normal posture. Extremities:  Without clubbing or edema. Neurologic:  Alert and  oriented x4;  grossly normal neurologically. Skin:  Intact without significant lesions or rashes. Psych:  Alert and cooperative. Normal mood and affect.   Impression/Plan: Chelsey Jordan is here for an EGD to be performed for GERD/dysphagia and colonoscopy due to history of colitis.   Risks, benefits, limitations, imponderables and alternatives regarding EGD have been reviewed with the patient. Questions have been answered. All parties agreeable.

## 2023-02-20 NOTE — Op Note (Signed)
[facility] Patient Name: Chelsey Jordan Procedure Date: 02/20/2023 11:36 AM MRN: [id number] Date of Birth: June 12, 1976 Attending MD: Hennie Duos. Marletta Lor , [place], [id number] CSN: [id number] Age: 47 Admit Type: Outpatient Procedure:                Colonoscopy Indications:              Abnormal CT of the GI tract, Follow-up of colitis Providers:                Hennie Duos. Marletta Lor, DO, Jannett Celestine, RN, Zena Amos Referring MD:              Medicines:                See the Anesthesia note for documentation of the                            administered medications Complications:            No immediate complications. Estimated Blood Loss:     Estimated blood loss was minimal. Procedure:                Pre-Anesthesia Assessment:                           - The anesthesia plan was to use monitored                            anesthesia care (MAC).                           After obtaining informed consent, the colonoscope                            was passed under direct vision. Throughout the                            procedure, the patient's blood pressure, pulse, and                            oxygen saturations were monitored continuously. The                            [id number]-HQ190L ([id number]) scope was introduced through                            the anus and advanced to the the terminal ileum,                            with identification of the appendiceal orifice and                            IC valve. The colonoscopy was performed without  difficulty. The patient tolerated the procedure                            well. The quality of the bowel preparation was                            evaluated using the BBPS [facility] Bowel Preparation                            Scale) with scores of: Right Colon = 3, Transverse                            Colon = 3 and Left Colon = 3 (entire mucosa seen                            well  with no residual staining, small fragments of                            stool or opaque liquid). The total BBPS score                            equals 9. Scope In: 11:58:57 AM Scope Out: 12:11:12 PM Scope Withdrawal Time: 0 hours 9 minutes 35 seconds  Total Procedure Duration: 0 hours 12 minutes 15 seconds  Findings:      Non-bleeding internal hemorrhoids were found during endoscopy.      The terminal ileum appeared normal.      A 4 mm polyp was found in the sigmoid colon. The polyp was sessile. The       polyp was removed with a cold snare. Resection and retrieval were       complete.      A 5 mm polyp was found in the ascending colon. The polyp was sessile.       The polyp was removed with a cold snare. Resection and retrieval were       complete.      There is no endoscopic evidence of inflammation in the entire colon. Impression:               - Non-bleeding internal hemorrhoids.                           - The examined portion of the ileum was normal.                           - One 4 mm polyp in the sigmoid colon, removed with                            a cold snare. Resected and retrieved.                           - One 5 mm polyp in the ascending colon, removed                            with a cold snare. Resected and retrieved. Moderate Sedation:  Per Anesthesia Care Recommendation:           - Patient has a contact number available for                            emergencies. The signs and symptoms of potential                            delayed complications were discussed with the                            patient. Return to normal activities tomorrow.                            Written discharge instructions were provided to the                            patient.                           - Resume previous diet.                           - Continue present medications.                           - Await pathology results.                           - Repeat  colonoscopy in 5 years for surveillance.                           - Return to GI clinic in 3 months. Procedure Code(s):        --- Professional ---                           [phone], Colonoscopy, flexible; with removal of                            tumor(s), polyp(s), or other lesion(s) by snare                            technique Diagnosis Code(s):        --- Professional ---                           K64.8, Other hemorrhoids                           D12.5, Benign neoplasm of sigmoid colon                           D12.2, Benign neoplasm of ascending colon                           K52.9, Noninfective gastroenteritis and colitis,  unspecified                           R93.3, Abnormal findings on diagnostic imaging of                            other parts of digestive tract CPT copyright 2022 American Medical Association. All rights reserved. The codes documented in this report are preliminary and upon coder review may  be revised to meet current compliance requirements. Hennie Duos. Marletta Lor, DO Hennie Duos. Marletta Lor, DO 02/20/2023 12:21:57 PM This report has been signed electronically. Number of Addenda: 0

## 2023-02-21 LAB — SURGICAL PATHOLOGY

## 2023-02-23 NOTE — Anesthesia Postprocedure Evaluation (Signed)
Anesthesia Post Note  Patient: Chelsey Jordan  Procedure(s) Performed: COLONOSCOPY WITH PROPOFOL ESOPHAGOGASTRODUODENOSCOPY (EGD) WITH PROPOFOL BIOPSY  Patient location during evaluation: Phase II Anesthesia Type: General Level of consciousness: awake Pain management: pain level controlled Vital Signs Assessment: post-procedure vital signs reviewed and stable Respiratory status: spontaneous breathing and respiratory function stable Cardiovascular status: blood pressure returned to baseline and stable Postop Assessment: no headache and no apparent nausea or vomiting Anesthetic complications: no Comments: Late entry   No notable events documented.   Last Vitals:  Vitals:   02/20/23 1205 02/20/23 1216  BP:  (!) 97/54  Pulse: 86 86  Resp: 19 19  Temp: 37 C 37 C  SpO2: 92% 93%    Last Pain:  Vitals:   02/20/23 1216  TempSrc: Oral  PainSc:                  Windell [place]

## 2023-02-27 ENCOUNTER — Encounter (HOSPITAL_COMMUNITY): Payer: Self-pay | Admitting: Internal Medicine

## 2023-04-21 ENCOUNTER — Encounter: Payer: Self-pay | Admitting: Gastroenterology

## 2023-04-21 NOTE — Progress Notes (Unsigned)
Referring Provider: Vania Rea, FNP Primary Care Physician:  Vania Rea, FNP Primary GI Physician: Dr. Marletta Lor  Chief Complaint  Patient presents with   Follow-up    Follow up. No problems    HPI:   Chelsey Jordan is a 47 y.o. female presenting today for follow-up of colitis, upper abdominal pain, and dysphagia s/p EGD and colonoscopy.   Patient had previously been evaluated in the emergency room 01/10/2023 for abdominal pain, nausea, vomiting, diarrhea x 2 to 3 weeks. CT A/P with contrast with liquid stool in the colon with wall thickening in the rectosigmoid region suggesting colitis.  She was prescribed Cipro and Flagyl x 10 days.  She was seen in our office 01/17/2023 for follow-up.  She reported she was feeling better since being on antibiotics, having 2 bowel movements per day that were typically mushy.  This was down for more than 8 bowel movements per day.  No overt GI bleeding.  Mild nausea, but no vomiting.  Mild tenderness in the epigastric and RUQ region not affected by meals and GERD controlled with omeprazole 40 mg daily.  She did report solid food dysphagia.  She is scheduled for EGD, colonoscopy, advised to complete her course of antibiotics.  Colonoscopy 02/20/23: Nonbleeding internal hemorrhoids, normal TI, 4 mm polyp in the sigmoid colon removed, 5 mm polyp in the ascending colon removed.  Pathology with tubular adenomas.  Recommended repeat colonoscopy in 5 years.  EGD 02/20/2023: Normal esophagus, gastritis and duodenitis biopsied.  Pathology with mild reactive gastropathy, negative for H. pylori.  Duodenal biopsies benign.  Recommended PPI twice daily   Today:  Take omeprazole twice a day for 2-3 weeks after her procedures, then started taking as needed.  She has not required in about 2 weeks at this point.  Prior dysphagia and upper abdominal pain have resolved.  No reflux symptoms, nausea, vomiting.  Bowel habits have returned to normal.  No BRBPR or  melena.  NSAIDs: None. Only tylenol.   She is concerned about waiting 5 years to her next colonoscopy.   Past Medical History:  Diagnosis Date   Hypertension    Vaginal Pap smear, abnormal     Past Surgical History:  Procedure Laterality Date   BIOPSY  02/20/2023   Procedure: BIOPSY;  Surgeon: Lanelle Bal, DO;  Location: [facility];  Service: Endoscopy;;   BREAST BIOPSY     CESAREAN SECTION     x2   COLONOSCOPY  04/05/2021   Dr. Gwendalyn Ege; 3 tubular adenomas, 2 hyperplastic polyps.  Recommended 3-year surveillance.   COLONOSCOPY WITH PROPOFOL N/A 02/20/2023   Dr. Marletta Lor; Nonbleeding internal hemorrhoids, normal TI, 4 mm polyp in the sigmoid colon removed, 5 mm polyp in the ascending colon removed.  Pathology with tubular adenomas.  Recommended repeat colonoscopy in 5 years.   ESOPHAGOGASTRODUODENOSCOPY (EGD) WITH PROPOFOL N/A 02/20/2023   Procedure: ESOPHAGOGASTRODUODENOSCOPY (EGD) WITH PROPOFOL;  Surgeon: Lanelle Bal, DO;  Location: [facility];  Service: Endoscopy;  Laterality: N/A;   GALLBLADDER SURGERY     TUBAL LIGATION      Current Outpatient Medications  Medication Sig Dispense Refill   Vitamin D, Ergocalciferol, (DRISDOL) 1.25 MG (50000 UNIT) CAPS capsule Take 50,000 Units by mouth every Wednesday.     omeprazole (PRILOSEC) 40 MG capsule Take 1 capsule (40 mg total) by mouth 2 (two) times daily. (Patient not taking: Reported on 04/23/2023) 60 capsule 11   RESTASIS 0.05 % ophthalmic emulsion Place 1  drop into both eyes 2 (two) times daily. (Patient not taking: Reported on 04/23/2023)     No current facility-administered medications for this visit.    Allergies as of 04/23/2023   (No Known Allergies)    Family History  Problem Relation Age of Onset   Hypertension Mother    Lung disease Mother    Lupus Mother    Hypertension Father    Heart disease Father    Crohn's disease Father    Cancer - Colon Neg Hx     Social History   Socioeconomic History    Marital status: Single    Spouse name: Not on file   Number of children: Not on file   Years of education: Not on file   Highest education level: Not on file  Occupational History   Not on file  Tobacco Use   Smoking status: Never   Smokeless tobacco: Never  Vaping Use   Vaping status: Never Used  Substance and Sexual Activity   Alcohol use: No   Drug use: No   Sexual activity: Not Currently    Partners: Male    Birth control/protection: Surgical  Other Topics Concern   Not on file  Social History Narrative   Not on file   Social Determinants of Health   Financial Resource Strain: Not on file  Food Insecurity: Not on file  Transportation Needs: Not on file  Physical Activity: Not on file  Stress: Not on file  Social Connections: Not on file    Review of Systems: Gen: Denies fever, chills, cold or flulike symptoms, presyncope, syncope. CV: Denies chest pain, palpitations. Resp: Denies dyspnea, cough. GI: See HPI Heme: See HPI  Physical Exam: BP 132/81 (BP Location: Left Arm, Patient Position: Sitting, Cuff Size: Large)   Pulse 76   Temp 97.8 F (36.6 C) (Temporal)   Ht 5\' 1"  (1.549 m)   Wt 217 lb (98.4 kg)   LMP 04/04/2023 Comment: After 2 years came back july 17  SpO2 96%   BMI 41.00 kg/m  General:   Alert and oriented. No distress noted. Pleasant and cooperative.  Head:  Normocephalic and atraumatic. Eyes:  Conjuctiva clear without scleral icterus. Heart:  S1, S2 present without murmurs appreciated. Lungs:  Clear to auscultation bilaterally. No wheezes, rales, or rhonchi. No distress.  Abdomen:  +BS, soft, non-tender and non-distended. No rebound or guarding. No HSM or masses noted. Msk:  Symmetrical without gross deformities. Normal posture. Extremities:  Without edema. Neurologic:  Alert and  oriented x4 Psych:  Normal mood and affect.    Assessment:  47 y.o. female presenting today for follow-up of colitis, upper abdominal pain, and dysphagia  s/p EGD and colonoscopy.   History of colitis: CT A/P with contrast 01/10/2023 with liquid stool in the colon with wall thickening in the rectosigmoid region suggesting colitis.  She is treated empirically with antibiotics.  Follow-up colonoscopy 02/20/2023 with nonbleeding internal hemorrhoids, normal TI, 2 tubular adenomas removed with recommendations to repeat colonoscopy in 5 years.  Clinically, she is doing well with prior abdominal pain and diarrhea completely resolved.  Upper abdominal pain: Likely secondary to gastritis/duodenitis noted on EGD in June with benign biopsies. Symptoms have now resolved with a course of omeprazole twice daily for a few weeks, but now taking as needed. Last dose was 2 weeks ago. Denies NSAIDs. No reflux symptoms.   Dysphagia: Resolved. EGD 02/20/2023 with normal-appearing esophagus, no esophageal dilation was performed.  Symptoms may have been related to  GERD which has now resolved.  History of colon polyps: Colonoscopy in July 2022 with 3 tubular adenomas and 2 hyperplastic polyps removed with recommendations for 3-year surveillance.  Most recent colonoscopy June 2024 with 2 tubular adenomas removed, recommended 5-year surveillance.  Patient voiced concern today about waiting 5 years for repeat colonoscopy due to concerns about developing new polyps and possibly colon cancer.  Advised that I would discuss with Dr. Marletta Lor.   Plan:  Continue to use omeprazole as needed.  Advised if she has recurrent reflux symptoms or upper abdominal pain 2 or 3 times a week, she would need to resume omeprazole every day. I discussed timing to next colonoscopy with Dr. Marletta Lor. Follow-up in office as needed.   Ermalinda Memos, PA-C [facility] 04/23/2023

## 2023-04-23 ENCOUNTER — Ambulatory Visit (INDEPENDENT_AMBULATORY_CARE_PROVIDER_SITE_OTHER): Payer: Medicaid Other | Admitting: Gastroenterology

## 2023-04-23 ENCOUNTER — Telehealth: Payer: Self-pay | Admitting: Gastroenterology

## 2023-04-23 ENCOUNTER — Encounter: Payer: Self-pay | Admitting: *Deleted

## 2023-04-23 ENCOUNTER — Encounter: Payer: Self-pay | Admitting: Gastroenterology

## 2023-04-23 VITALS — BP 132/81 | HR 76 | Temp 97.8°F | Ht 61.0 in | Wt 217.0 lb

## 2023-04-23 DIAGNOSIS — Z86010 Personal history of colonic polyps: Secondary | ICD-10-CM

## 2023-04-23 DIAGNOSIS — R1310 Dysphagia, unspecified: Secondary | ICD-10-CM

## 2023-04-23 DIAGNOSIS — R1010 Upper abdominal pain, unspecified: Secondary | ICD-10-CM | POA: Diagnosis not present

## 2023-04-23 DIAGNOSIS — K529 Noninfective gastroenteritis and colitis, unspecified: Secondary | ICD-10-CM

## 2023-04-23 NOTE — Telephone Encounter (Signed)
Sent MyChart message. She read it and said thank you.

## 2023-04-23 NOTE — Telephone Encounter (Signed)
Noted  

## 2023-04-23 NOTE — Telephone Encounter (Signed)
Chelsey Jordan:  Please reach patient with Spanish interpreter and let her know that Dr. Marletta Lor advised that we can repeat her colonoscopy in 3 years rather than 5 years.   [name]: Please place patient on recall for colonoscopy in 3 years rather than 5 years.

## 2023-04-23 NOTE — Patient Instructions (Addendum)
Instrucciones en espaol: Me alegro que te sientas mucho mejor!  Puede continuar usando omeprazol segn sea necesario por ahora.  Si comienza a Nature conservation officer superior del abdomen o sntomas de reflujo que ocurren 2 o 3 veces por semana, debe volver a tomar omeprazol [employer].  Contine evitando los productos AINE.  Hablar sobre el momento de su prxima colonoscopia con el Dr. Marletta Lor y le har saber lo que recomienda.  De lo contrario, como se sienta mucho mejor, podemos planificar el seguimiento segn sea necesario.  Fue genial verte de nuevo hoy!   Ermalinda Memos, PA-C [facility]    English Instructions:  I am glad you are feeling much better!  You may continue to use omeprazole as needed for now.  If you begin to have upper abdominal pain or reflux symptoms occurring 2 or 3 times a week, you should resume taking omeprazole every day.  Continue to avoid NSAID products.  I will discuss the timing of her next colonoscopy with Dr. Marletta Lor and let you know what he recommends.  Otherwise, as you are feeling much better, we can plan to follow-up as needed.  It was great to see you again today!  Ermalinda Memos, PA-C [facility]

## 2023-05-22 ENCOUNTER — Encounter: Payer: Self-pay | Admitting: Adult Medicine

## 2023-05-22 DIAGNOSIS — Z1231 Encounter for screening mammogram for malignant neoplasm of breast: Secondary | ICD-10-CM

## 2023-05-24 ENCOUNTER — Other Ambulatory Visit: Payer: Self-pay | Admitting: Family Medicine

## 2023-05-24 DIAGNOSIS — Z1231 Encounter for screening mammogram for malignant neoplasm of breast: Secondary | ICD-10-CM

## 2023-05-30 ENCOUNTER — Ambulatory Visit: Payer: Medicaid Other

## 2023-06-13 ENCOUNTER — Ambulatory Visit
Admission: RE | Admit: 2023-06-13 | Discharge: 2023-06-13 | Disposition: A | Discharge status: Home / Self Care | Payer: Medicaid Other | Source: Ambulatory Visit | Attending: Family Medicine | Admitting: Family Medicine

## 2023-06-13 DIAGNOSIS — Z1231 Encounter for screening mammogram for malignant neoplasm of breast: Secondary | ICD-10-CM

## 2023-06-22 IMAGING — MG DIGITAL DIAGNOSTIC BILAT W/ TOMO W/ CAD
8 series · 8 of 24 positions shown · non-contrast
Comparison: Previous exam(s).

CLINICAL DATA: 46-year-old female presenting for annual bilateral
mammogram and follow-up of probably benign left breast masses.

EXAM:
DIGITAL DIAGNOSTIC BILATERAL MAMMOGRAM WITH TOMOSYNTHESIS AND CAD;
ULTRASOUND LEFT BREAST LIMITED
TECHNIQUE: Bilateral digital diagnostic mammography and breast tomosynthesis
was performed. The images were evaluated with computer-aided
detection.; Targeted ultrasound examination of the left breast was
performed.

[R CC synth-2D]
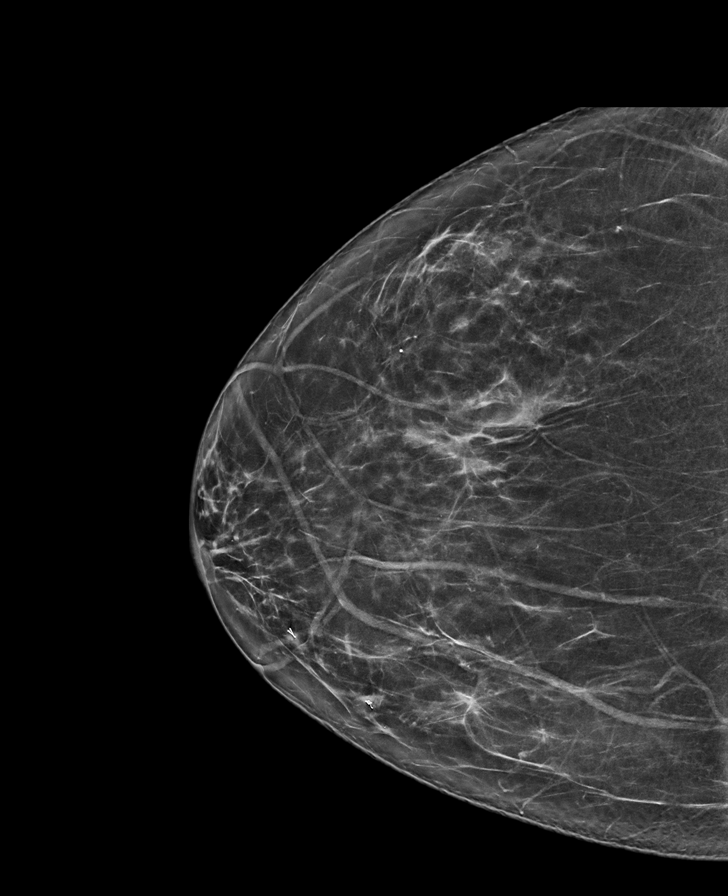

[L MLO synth-2D]
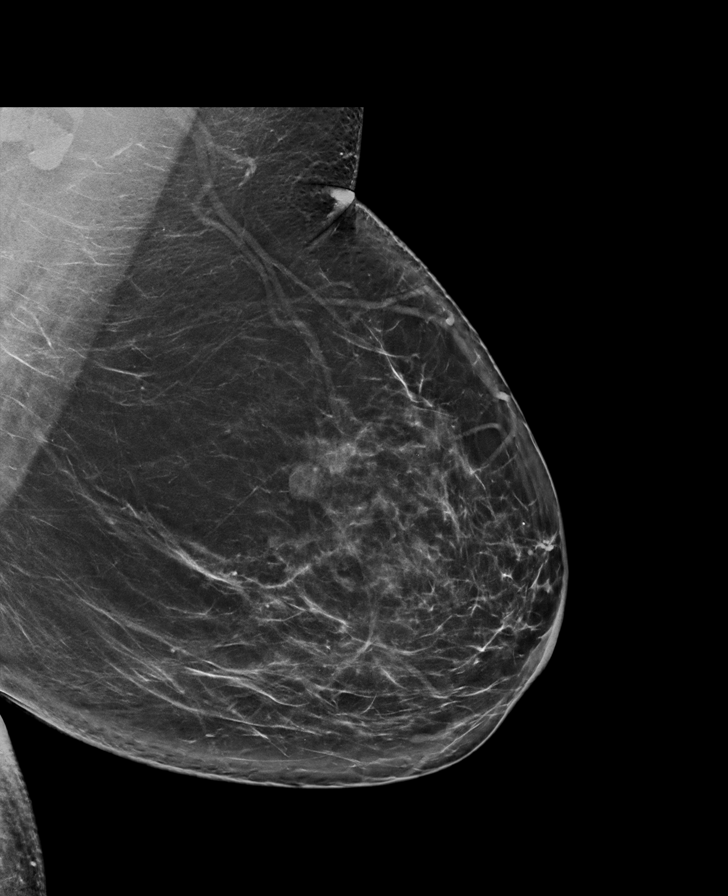

[R MLO synth-2D]
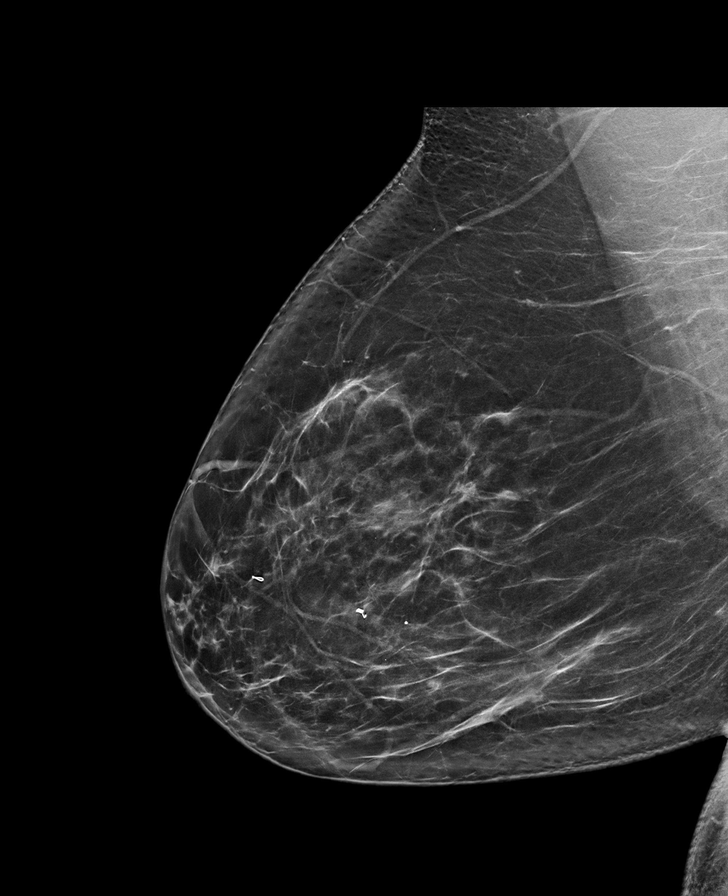

[L CC synth-2D]
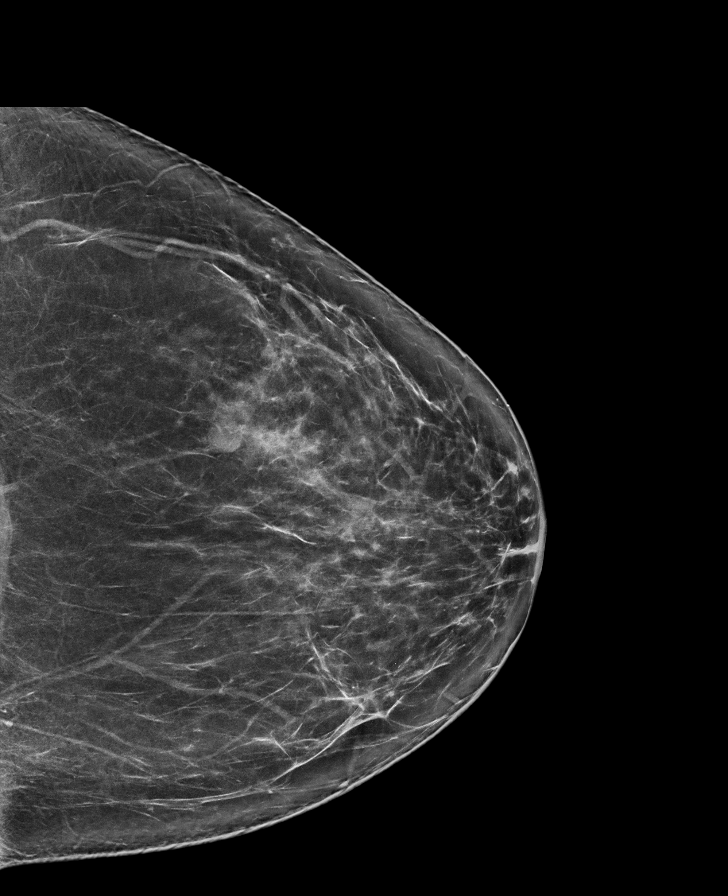

[R MLO tomo · tomo slice 45/90.0]
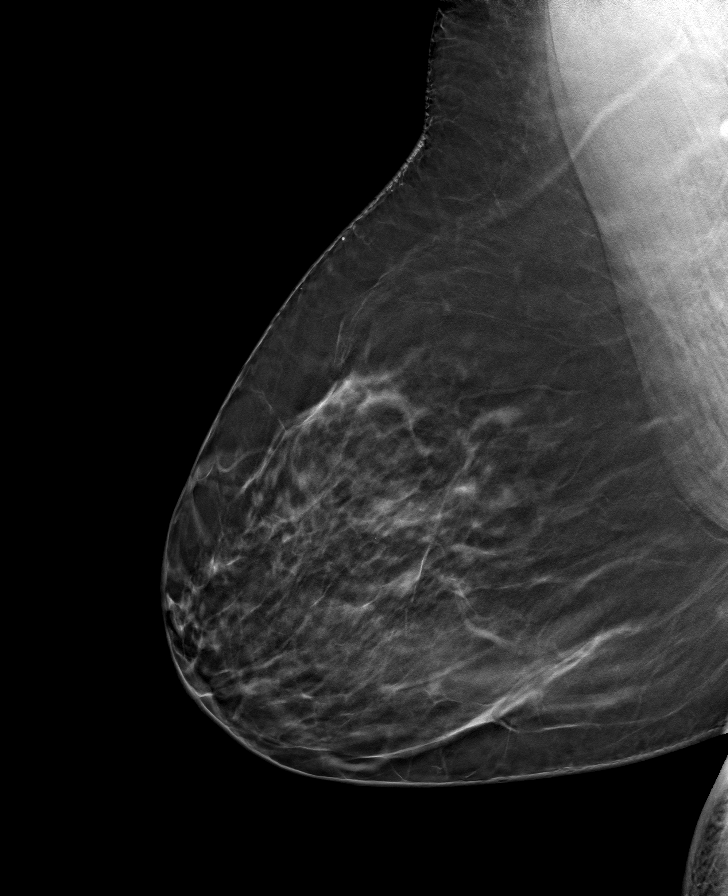

[L CC tomo · tomo slice 43/84.0]
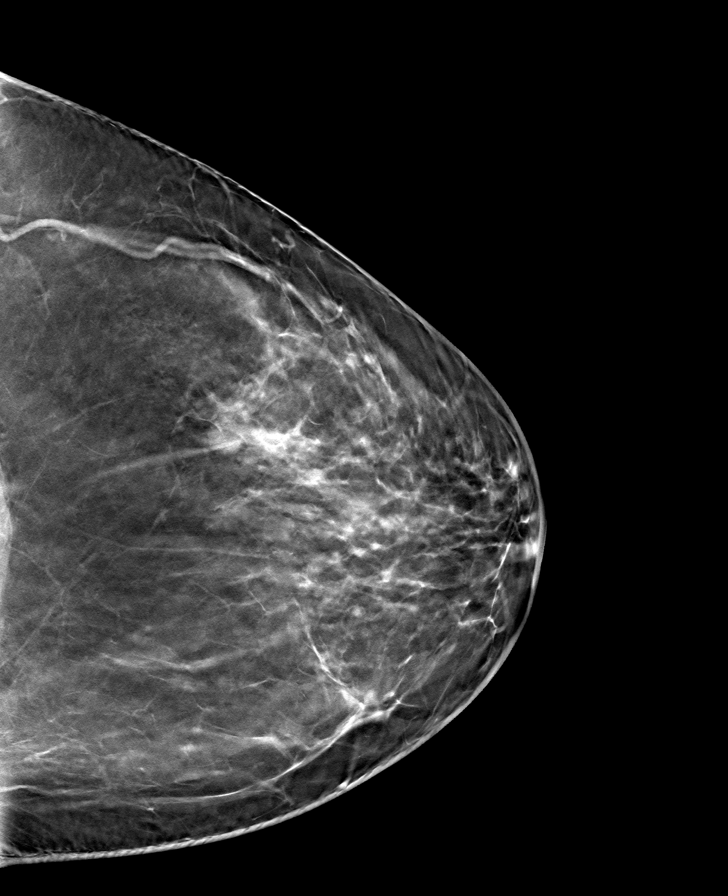

[L MLO tomo · tomo slice 47/92.0]
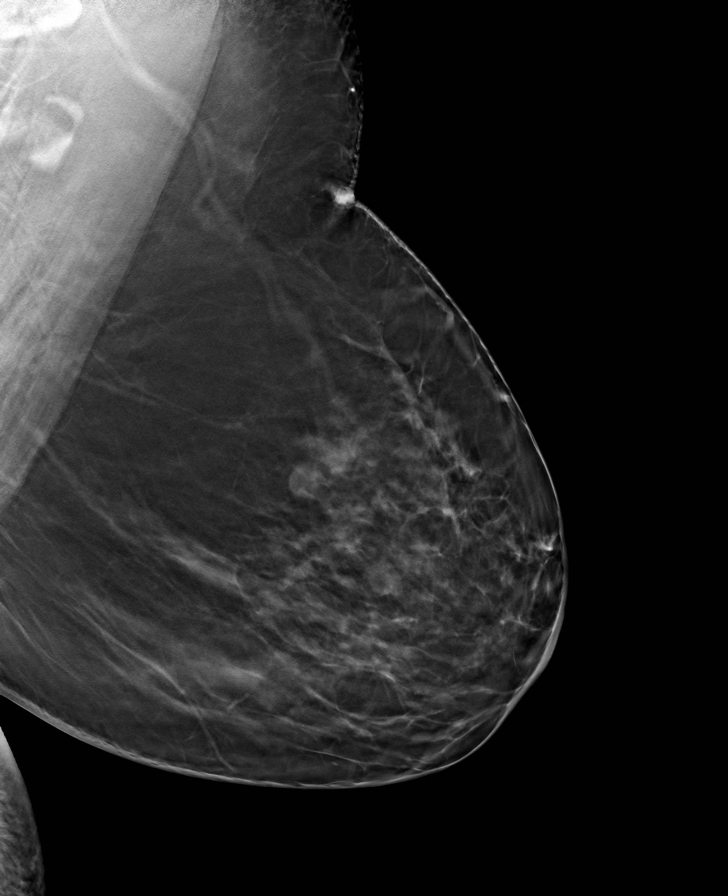

[R CC tomo · tomo slice 39/76.0]
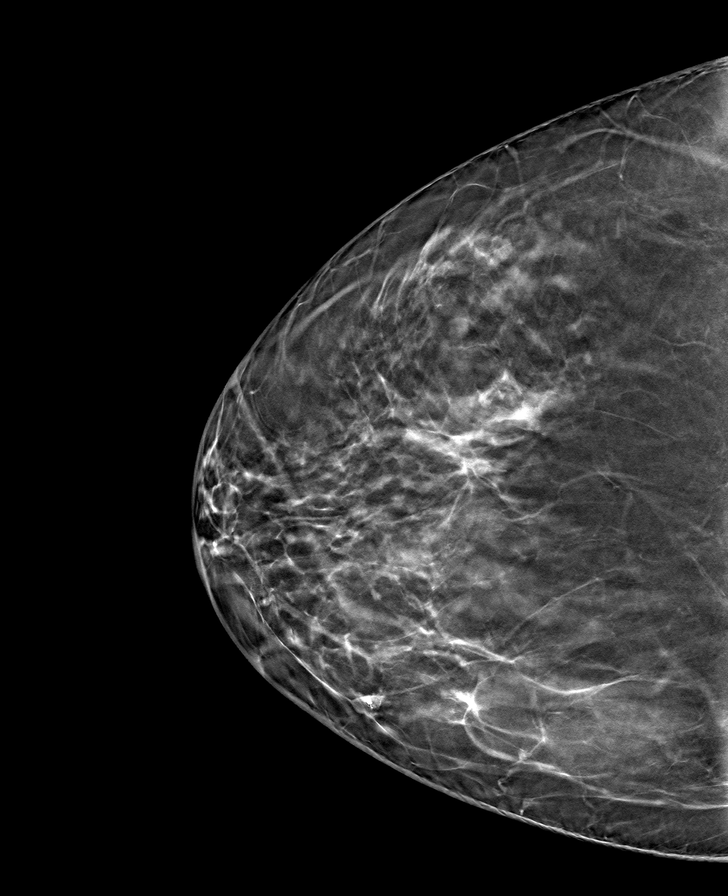

[8 of 24 positions shown; findings below may reference images not displayed]

ACR Breast Density Category b: There are scattered areas of
fibroglandular density.
FINDINGS: No new or suspicious mammographic findings are identified in either
breast. The parenchymal pattern is stable. Circumscribed mass in the
upper outer left breast is unchanged.

Targeted ultrasound is performed, showing stable appearance
circumscribed hypoechoic masses along the 2 o'clock position. A mass
at the 2 o'clock position 1 cm from the nipple measures 10 x 8 x 4
mm (previously 9 x 8 x 4 mm). A mass at the 2 o'clock position 3 cm
from the nipple measures 6 x 6 x 2 mm (previously 6 x 5 x 2 mm).
IMPRESSION: 1. No mammographic evidence of malignancy in either breast.
2. Benign left breast masses demonstrating long-term stability. No
further follow-up required.

RECOMMENDATION:
Screening mammogram in one year.(Code:TM-W-PT7)

I have discussed the findings and recommendations with the patient.
If applicable, a reminder letter will be sent to the patient
regarding the next appointment.

BI-RADS CATEGORY  2: Benign.

## 2023-06-22 IMAGING — US US BREAST*L* LIMITED INC AXILLA
1 series · 10 of 10 positions shown · non-contrast
Comparison: Previous exam(s).

CLINICAL DATA: 46-year-old female presenting for annual bilateral
mammogram and follow-up of probably benign left breast masses.

EXAM:
DIGITAL DIAGNOSTIC BILATERAL MAMMOGRAM WITH TOMOSYNTHESIS AND CAD;
ULTRASOUND LEFT BREAST LIMITED
TECHNIQUE: Bilateral digital diagnostic mammography and breast tomosynthesis
was performed. The images were evaluated with computer-aided
detection.; Targeted ultrasound examination of the left breast was
performed.

[Series 1: us breast*left* limited inc axilla · 0.07mm/px · 10 of 10 slices shown]
[im 1/10]
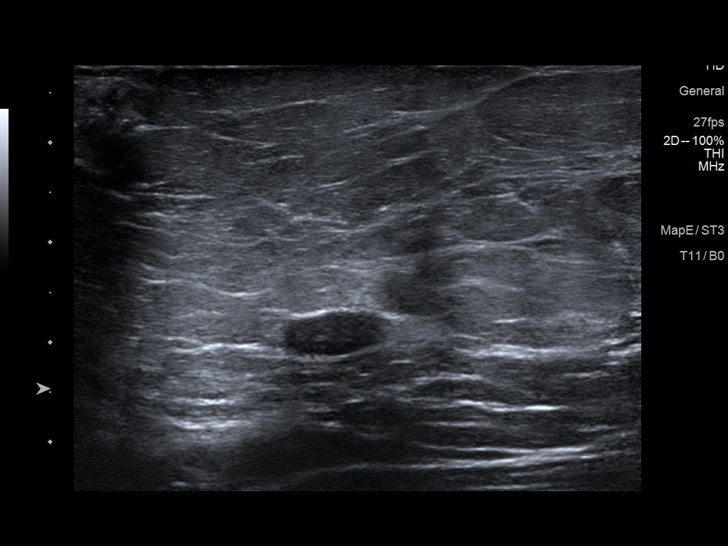
[im 2/10]
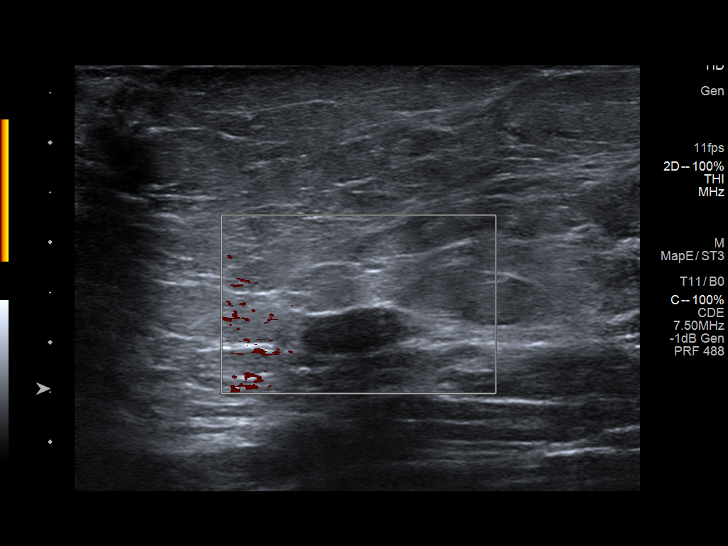
[im 3/10]
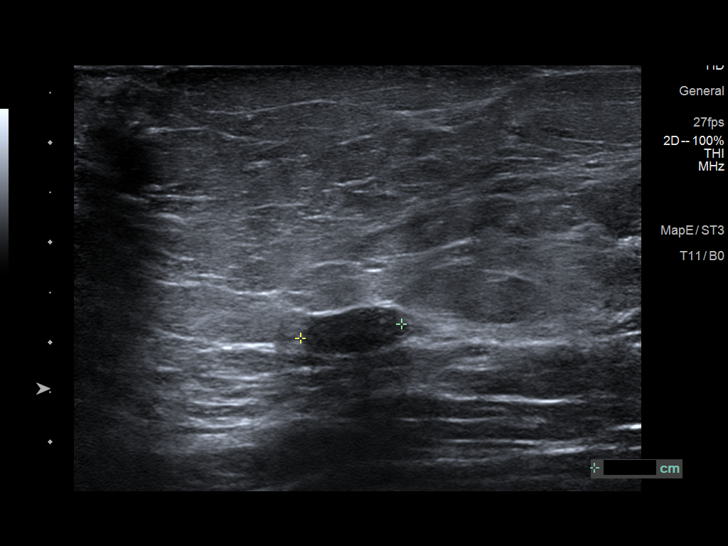
[im 4/10]
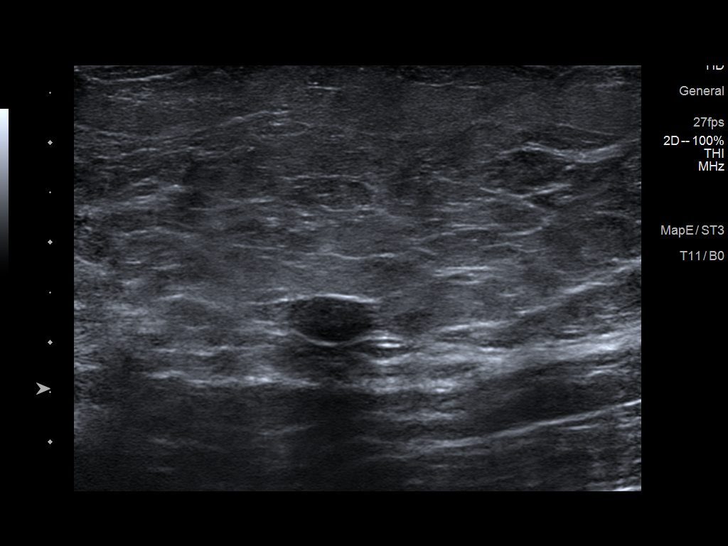
[im 5/10]
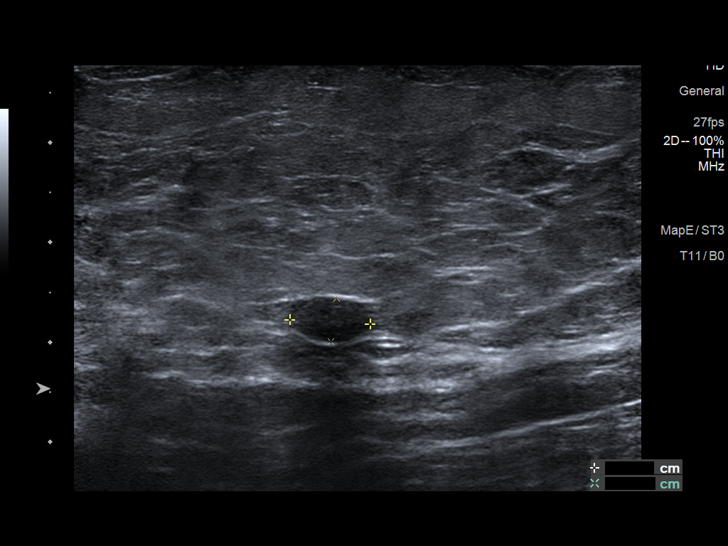
[im 6/10]
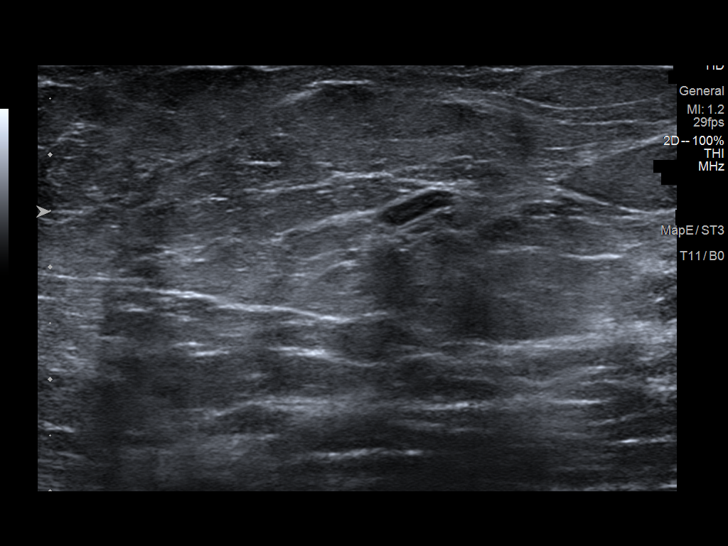
[im 7/10]
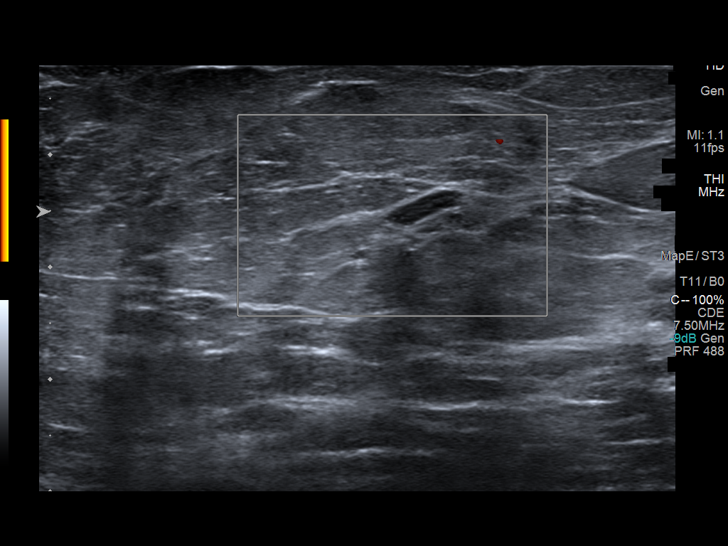
[im 8/10]
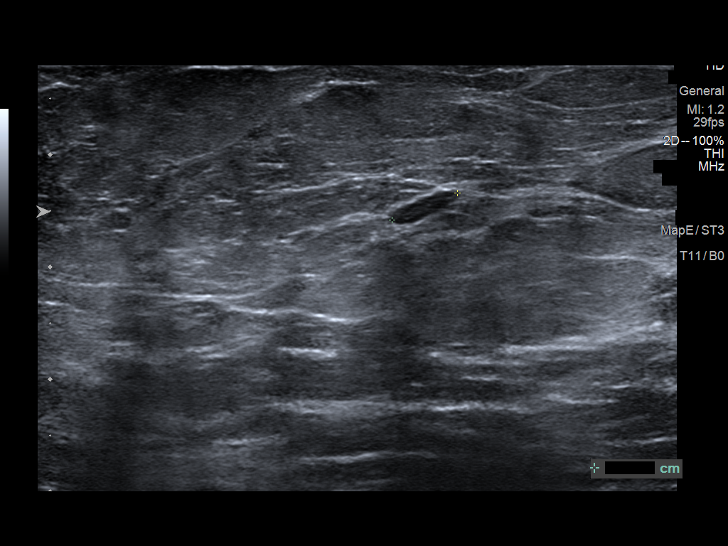
[im 9/10]
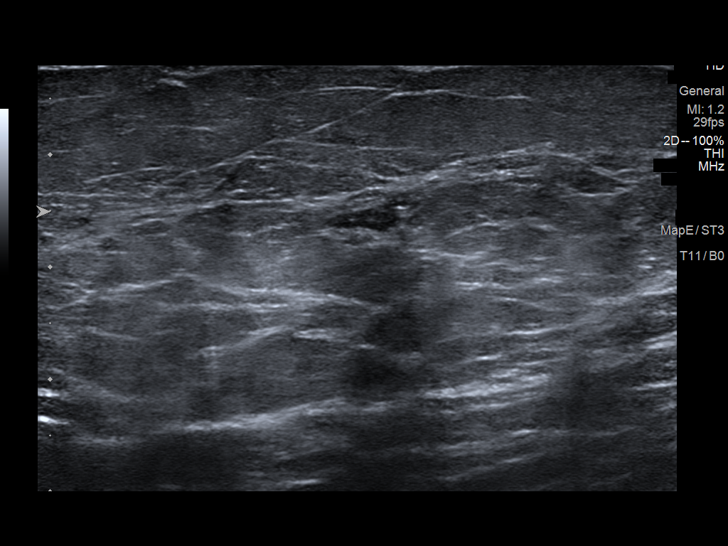
[im 10/10]
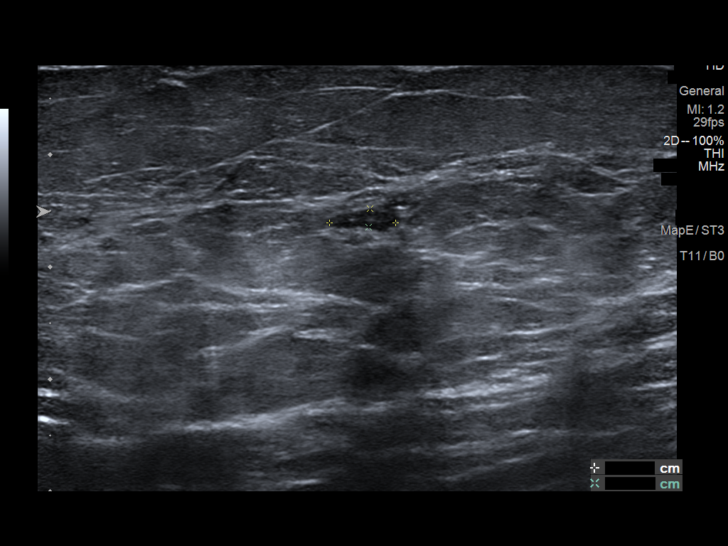

[10 of 10 positions shown; findings below may reference images not displayed]

ACR Breast Density Category b: There are scattered areas of
fibroglandular density.
FINDINGS: No new or suspicious mammographic findings are identified in either
breast. The parenchymal pattern is stable. Circumscribed mass in the
upper outer left breast is unchanged.

Targeted ultrasound is performed, showing stable appearance
circumscribed hypoechoic masses along the 2 o'clock position. A mass
at the 2 o'clock position 1 cm from the nipple measures 10 x 8 x 4
mm (previously 9 x 8 x 4 mm). A mass at the 2 o'clock position 3 cm
from the nipple measures 6 x 6 x 2 mm (previously 6 x 5 x 2 mm).
IMPRESSION: 1. No mammographic evidence of malignancy in either breast.
2. Benign left breast masses demonstrating long-term stability. No
further follow-up required.

RECOMMENDATION:
Screening mammogram in one year.(Code:TM-W-PT7)

I have discussed the findings and recommendations with the patient.
If applicable, a reminder letter will be sent to the patient
regarding the next appointment.

BI-RADS CATEGORY  2: Benign.

## 2024-04-16 ENCOUNTER — Other Ambulatory Visit: Payer: Self-pay

## 2024-04-16 ENCOUNTER — Encounter (HOSPITAL_COMMUNITY): Payer: Self-pay

## 2024-04-16 ENCOUNTER — Emergency Department (HOSPITAL_COMMUNITY)

## 2024-04-16 ENCOUNTER — Emergency Department (HOSPITAL_COMMUNITY)
Admission: EM | Admit: 2024-04-16 | Discharge: 2024-04-16 | Disposition: A | Discharge status: Home / Self Care | Attending: Emergency Medicine | Admitting: Emergency Medicine

## 2024-04-16 DIAGNOSIS — M5020 Other cervical disc displacement, unspecified cervical region: Secondary | ICD-10-CM | POA: Insufficient documentation

## 2024-04-16 DIAGNOSIS — M542 Cervicalgia: Secondary | ICD-10-CM | POA: Diagnosis present

## 2024-04-16 MED ORDER — KETOROLAC TROMETHAMINE 30 MG/ML IJ SOLN: 30.0000 mg | Freq: Once | INTRAMUSCULAR | Status: DC

## 2024-04-16 MED ORDER — DICLOFENAC EPOLAMINE 1.3 % EX PTCH
1.0000 | MEDICATED_PATCH | Freq: Once | CUTANEOUS | Status: DC
  Administered 2024-04-16: 1 via TRANSDERMAL
  Filled 2024-04-16: qty 1

## 2024-04-16 MED ORDER — PREDNISONE 10 MG PO TABS: 40.0000 mg | ORAL_TABLET | Freq: Every day | ORAL | 0 refills | Status: DC

## 2024-04-16 MED ORDER — DEXAMETHASONE SODIUM PHOSPHATE 4 MG/ML IJ SOLN
4.0000 mg | Freq: Once | INTRAMUSCULAR | Status: AC
  Administered 2024-04-16: 4 mg via INTRAMUSCULAR
  Filled 2024-04-16: qty 1

## 2024-04-16 MED ORDER — CYCLOBENZAPRINE HCL 10 MG PO TABS
10.0000 mg | ORAL_TABLET | Freq: Once | ORAL | Status: AC
  Administered 2024-04-16: 10 mg via ORAL
  Filled 2024-04-16: qty 1

## 2024-04-16 MED ORDER — KETOROLAC TROMETHAMINE 30 MG/ML IJ SOLN
30.0000 mg | Freq: Once | INTRAMUSCULAR | Status: AC
  Administered 2024-04-16: 30 mg via INTRAMUSCULAR
  Filled 2024-04-16: qty 1

## 2024-04-16 MED ORDER — HYDROCODONE-ACETAMINOPHEN 5-325 MG PO TABS
1.0000 | ORAL_TABLET | Freq: Once | ORAL | Status: AC
  Administered 2024-04-16: 1 via ORAL
  Filled 2024-04-16: qty 1

## 2024-04-16 MED ORDER — DICLOFENAC SODIUM 1 % EX GEL: 4.0000 g | Freq: Four times a day (QID) | CUTANEOUS | 0 refills | Status: DC

## 2024-04-16 MED ORDER — GADOBUTROL 1 MMOL/ML IV SOLN
10.0000 mL | Freq: Once | INTRAVENOUS | Status: AC | PRN
  Administered 2024-04-16: 10 mL via INTRAVENOUS

## 2024-04-16 NOTE — Discharge Instructions (Addendum)
 Make appointment as soon as possible with Neurology.

## 2024-04-16 NOTE — ED Triage Notes (Signed)
 Pt arrived via POV from home c/o sudden right arm, neck and back pain that began this morning. Spanish interpreter utilized in Triage. Pt denies injury. Pt presents with limited mobility and stiffness in RUE.

## 2024-04-16 NOTE — ED Provider Notes (Signed)
 [place] EMERGENCY DEPARTMENT AT [facility] Provider Note   CSN: [id number] Arrival date & time: 04/16/24  1204     Patient presents with: Arm Pain (right)   Chelsey Jordan is a 48 y.o. female.   48 y/o Female presents for shoulder pain since this morning. Pt is spanish speaking and daughter is in the room as interrupter. Pt was taking a shower this morning and was about to get out of the shower when her shoulder suddenly locked up and she unable to move it. Pt reports she had no falls, injury, or trauma to the area. Pt has been unable to move it to her side and finds relief when she bends her neck forward. Pt reports no history of medical conditions and no prescribed medications. Pt has tried OTC medications for pain without relief. Pt denies any recent falls or illness.    Arm Pain Pertinent negatives include no headaches and no shortness of breath.       Prior to Admission medications   Medication Sig Start Date End Date Taking? Authorizing Provider  diclofenac  Sodium (VOLTAREN ) 1 % GEL Apply 4 g topically 4 (four) times daily. 04/16/24  Yes Myriam Fonda RAMAN, PA-C  predniSONE  (DELTASONE ) 10 MG tablet Take 4 tablets (40 mg total) by mouth daily. 04/16/24  Yes Therman Hughlett S, PA-C  VENTOLIN HFA 108 (90 Base) MCG/ACT inhaler Inhale 1-2 puffs into the lungs every 4 (four) hours as needed for wheezing or shortness of breath. 03/26/24   [provider]  Vitamin D, Ergocalciferol, (DRISDOL) 1.25 MG (50000 UNIT) CAPS capsule Take 50,000 Units by mouth every Wednesday. 12/09/21   [provider]    Allergies: Patient has no known allergies.    Review of Systems  Respiratory:  Negative for shortness of breath.   Cardiovascular: Negative.   Gastrointestinal:  Negative for nausea and vomiting.  Musculoskeletal:  Positive for back pain, myalgias, neck pain and neck stiffness. Negative for joint swelling.  Neurological:  Negative for dizziness, facial  asymmetry, light-headedness, numbness and headaches.    Updated Vital Signs BP (!) 151/94 (BP Location: Left Wrist)   Pulse 83   Temp 98.6 F (37 C) (Oral)   Resp 18   Ht 5' 1 (1.549 m)   Wt 98.4 kg   SpO2 93%   BMI 41.00 kg/m   Physical Exam Constitutional:      Appearance: Normal appearance.  HENT:     Head: Normocephalic and atraumatic.  Eyes:     Pupils: Pupils are equal, round, and reactive to light.  Cardiovascular:     Rate and Rhythm: Normal rate.  Pulmonary:     Effort: Pulmonary effort is normal.     Breath sounds: Normal breath sounds.  Musculoskeletal:     Right shoulder: Tenderness present. No bony tenderness or crepitus. Decreased range of motion. Normal pulse.     Left shoulder: Normal.     Cervical back: Tenderness present.  Skin:    General: Skin is warm and dry.  Neurological:     Mental Status: She is alert and oriented to person, place, and time.     Gait: Gait normal.  Psychiatric:        Mood and Affect: Mood normal.        Behavior: Behavior normal.     (all labs ordered are listed, but only abnormal results are displayed) Labs Reviewed - No data to display  EKG: None  Radiology: MR Cervical Spine W and  Wo Contrast Result Date: 04/16/2024 CLINICAL DATA:  Provided history: Cervical radiculopathy, no red flags. EXAM: MRI CERVICAL SPINE WITHOUT AND WITH CONTRAST TECHNIQUE: Multiplanar and multiecho pulse sequences of the cervical spine, to include the craniocervical junction and cervicothoracic junction, were obtained without and with intravenous contrast. CONTRAST:  10mL GADAVIST  GADOBUTROL  1 MMOL/ML IV SOLN COMPARISON:  None. FINDINGS: Alignment: Nonspecific reversal of the expected cervical lordosis. Slight grade 1 anterolisthesis at C3-C4, C4-C5 and C5-C6. Vertebrae: Cervical vertebral body height is maintained. Mild degenerative endplate edema and enhancement at C6-C7. Cord: No signal abnormality identified within the cervical spinal cord.  No pathologic spinal cord enhancement. Mild flattening of the ventral spinal cord at C6-C7 as described below. Posterior Fossa, vertebral arteries, paraspinal tissues: No abnormality identified within included portions of the posterior fossa. Flow voids preserved within the visible portions of the cervical vertebral arteries. No paraspinal mass or collection. Disc levels: Multilevel disc degeneration, greatest at C6-C7 (moderate at this level). C2-C3: Facet arthropathy on the right. No significant disc herniation or stenosis. C3-C4: Slight grade 1 anterolisthesis. No significant disc herniation or stenosis. C4-C5: Slight grade 1 anterolisthesis. No significant disc herniation or stenosis. C5-C6: Slight grade 1 anterolisthesis. Small central disc protrusion. The disc protrusion mildly effaces the ventral thecal sac without significant spinal canal stenosis. No significant foraminal stenosis. C6-C7: Disc bulge. Superimposed sizable broad-based right center/foraminal disc protrusion. The disc protrusion results in mild spinal canal stenosis (effacing the ventral thecal sac on the right and mildly flattening the ventral aspect of the spinal cord). The disc protrusion also severely narrows the right foraminal entry zone, encroaching upon the exiting right C7 nerve root (for instance as seen on series 8, image 23). C7-T1: No significant disc herniation or stenosis. IMPRESSION: 1. Cervical spondylosis as outlined within the body of the report. 2. At C6-C7, there is moderate disc degeneration (with degenerative endplate edema/ enhancement). Disc bulge. Superimposed sizable broad-based right center/foraminal disc protrusion. The disc protrusion results in mild spinal canal stenosis (effacing the ventral thecal sac on the right and mildly flattening the ventral aspect of the spinal cord). The disc protrusion also severely narrows the right foraminal entry zone and encroaches upon the exiting right C7 nerve root. Correlate for  right C7 radiculopathy. 3. No significant spinal canal or foraminal stenosis at the remaining levels. 4. Mild grade 1 anterolisthesis at C3-C4, C4-C5 and C5-C6. Electronically Signed   By: Rockey Childs D.O.   On: 04/16/2024 15:02     Procedures   Medications Ordered in the ED  diclofenac  (FLECTOR ) 1.3 % 1 patch (1 patch Transdermal Patch Applied 04/16/24 1522)  cyclobenzaprine  (FLEXERIL ) tablet 10 mg (10 mg Oral Given 04/16/24 1312)  ketorolac  (TORADOL ) 30 MG/ML injection 30 mg (30 mg Intramuscular Given 04/16/24 1313)  gadobutrol  (GADAVIST ) 1 MMOL/ML injection 10 mL (10 mLs Intravenous Contrast Given 04/16/24 1421)  HYDROcodone -acetaminophen  (NORCO/VICODIN) 5-325 MG per tablet 1 tablet (1 tablet Oral Given 04/16/24 1449)  dexamethasone  (DECADRON ) injection 4 mg (4 mg Intramuscular Given 04/16/24 1523)                                    Medical Decision Making 49 y/o Female presents with shoulder and neck pain with insidious onset. Pt is sitting on bed in obvious pain with Rt arm held in the air. On exam pt has pain to palpation throughout entire shoulder and reports severe pain when she tries to lower  arm. Was able to passively move the arm at the elbow with some pain reported. Upon palpation of the cervical spine pt reported a shooting pain down her arm. No obvious deformities noted on palpation. No focal neurologic deficits noted on exam.   Due to radicular pain noted and sudden onset MRI was ordered to rule out nerve impingement. Pt was given medication to try to ease pain while waiting for MRI. Muscle relaxer and Toradol  did not help the pt's pain.   Pt was given Norco, decadron , and diclofenac  for pain management.  MRI showed  Disc bulge. Superimposed sizable broad-based right center/foraminal disc protrusion. The disc protrusion results in mild spinal canal stenosis. Neuro consulted for follow up.   Pt advised to follow up with neurology ASAP. Steroid prescription written and treatment  plan discussed with pt. Pt agreed and was discharged.      Amount and/or Complexity of Data Reviewed Radiology: ordered.  Risk Prescription drug management.       Final diagnoses:  Herniated disc, cervical    ED Discharge Orders          Ordered    predniSONE  (DELTASONE ) 10 MG tablet  Daily        04/16/24 1549    diclofenac  Sodium (VOLTAREN ) 1 % GEL  4 times daily        04/16/24 1549               Myriam Fonda RAMAN, [place] 04/16/24 1551    Garrick Charleston, MD 04/17/24 1610

## 2024-04-18 ENCOUNTER — Emergency Department (HOSPITAL_COMMUNITY): Admission: EM | Admit: 2024-04-18 | Discharge: 2024-04-18 | Disposition: A | Discharge status: Home / Self Care

## 2024-04-18 ENCOUNTER — Encounter (HOSPITAL_COMMUNITY): Payer: Self-pay

## 2024-04-18 ENCOUNTER — Emergency Department (HOSPITAL_COMMUNITY)

## 2024-04-18 ENCOUNTER — Other Ambulatory Visit: Payer: Self-pay

## 2024-04-18 DIAGNOSIS — M5020 Other cervical disc displacement, unspecified cervical region: Secondary | ICD-10-CM | POA: Insufficient documentation

## 2024-04-18 DIAGNOSIS — M542 Cervicalgia: Secondary | ICD-10-CM | POA: Diagnosis present

## 2024-04-18 DIAGNOSIS — M5412 Radiculopathy, cervical region: Secondary | ICD-10-CM | POA: Insufficient documentation

## 2024-04-18 LAB — BASIC METABOLIC PANEL WITH GFR
Anion gap: 8 (ref 5–15)
BUN: 15 mg/dL (ref 6–20)
CO2: 21 mmol/L — ABNORMAL LOW (ref 22–32)
Calcium: 9 mg/dL (ref 8.9–10.3)
Chloride: 112 mmol/L — ABNORMAL HIGH (ref 98–111)
Creatinine, Ser: 0.7 mg/dL (ref 0.44–1.00)
GFR, Estimated: >60 mL/min (ref >60)
Glucose, Bld: 90 mg/dL (ref 70–99)
Potassium: 3.7 mmol/L (ref 3.5–5.1)
Sodium: 141 mmol/L (ref 135–145)

## 2024-04-18 LAB — CBC
HCT: 45.3 % (ref 36.0–46.0)
Hemoglobin: 14.9 g/dL (ref 12.0–15.0)
MCH: 26.9 pg (ref 26.0–34.0)
MCHC: 32.9 g/dL (ref 30.0–36.0)
MCV: 81.9 fL (ref 80.0–100.0)
Platelets: 242 K/uL (ref 150–400)
RBC: 5.53 MIL/uL — ABNORMAL HIGH (ref 3.87–5.11)
RDW: 15 % (ref 11.5–15.5)
WBC: 10.4 K/uL (ref 4.0–10.5)
nRBC: 0 % (ref 0.0–0.2)

## 2024-04-18 LAB — HCG, SERUM, QUALITATIVE: Preg, Serum: NEGATIVE

## 2024-04-18 LAB — TROPONIN I (HIGH SENSITIVITY)
Troponin I (High Sensitivity): 11 ng/L (ref <18)
Troponin I (High Sensitivity): 8 ng/L (ref <18)

## 2024-04-18 MED ORDER — GABAPENTIN 100 MG PO CAPS: 100.0000 mg | ORAL_CAPSULE | Freq: Three times a day (TID) | ORAL | 1 refills | Status: DC

## 2024-04-18 MED ORDER — METHOCARBAMOL 500 MG PO TABS: 500.0000 mg | ORAL_TABLET | Freq: Two times a day (BID) | ORAL | 0 refills | Status: DC

## 2024-04-18 MED ORDER — IBUPROFEN 800 MG PO TABS: 800.0000 mg | ORAL_TABLET | Freq: Three times a day (TID) | ORAL | 0 refills | Status: DC

## 2024-04-18 MED ORDER — KETOROLAC TROMETHAMINE 15 MG/ML IJ SOLN
15.0000 mg | Freq: Once | INTRAMUSCULAR | Status: AC
  Administered 2024-04-18: 15 mg via INTRAMUSCULAR
  Filled 2024-04-18: qty 1

## 2024-04-18 MED ORDER — CYCLOBENZAPRINE HCL 10 MG PO TABS
5.0000 mg | ORAL_TABLET | Freq: Once | ORAL | Status: AC
  Administered 2024-04-18: 5 mg via ORAL
  Filled 2024-04-18: qty 1

## 2024-04-18 MED ORDER — OXYCODONE-ACETAMINOPHEN 5-325 MG PO TABS
1.0000 | ORAL_TABLET | Freq: Once | ORAL | Status: AC
  Administered 2024-04-18: 1 via ORAL
  Filled 2024-04-18: qty 1

## 2024-04-18 MED ORDER — OXYCODONE HCL 5 MG PO TABS: 5.0000 mg | ORAL_TABLET | Freq: Four times a day (QID) | ORAL | 0 refills | Status: DC | PRN

## 2024-04-18 MED ORDER — OXYCODONE HCL 5 MG PO TABS: 5.0000 mg | ORAL_TABLET | Freq: Four times a day (QID) | ORAL | 0 refills | Status: AC | PRN

## 2024-04-18 NOTE — ED Provider Notes (Signed)
 [place] EMERGENCY DEPARTMENT AT [facility] Provider Note   CSN: [id number] Arrival date & time: 04/18/24  1033     Patient presents with: Chest Pain, Rt arm Pain, and Neck Pain   Chelsey Jordan is a 48 y.o. female.    Chest Pain   Presents because of right arm pain, neck pain.  Chief complaint was chief complaint as well.  Speaks the patient, she is not any kind of chest pain.  More associated with the right shoulder.  No clear chest pain.  No hemoptysis.  No exertional chest pain.  Denies any cardiac history.  Denies any history of DVT or PE.  Patient does state that she has ongoing pain in the right trapezius area.  Right shoulder.  She endorse some sensory changes down her right arm at times.  No bowel or bladder incontinence.  No numbness or weakness of the left hand.  Has not been dropping any objects with either hand.  No fever no chills.  No difficulty walking.  Previous medical history reviewed : Patient was last seen in the ED on April 16, 2024.  MRI showed disc bulge, some pro sizable broad-based right central 4 minimal disc protrusion.  Mild bilateral canal stenosis.     Prior to Admission medications   Medication Sig Start Date End Date Taking? Authorizing Provider  diclofenac  Sodium (VOLTAREN ) 1 % GEL Apply 4 g topically 4 (four) times daily. 04/16/24   Myriam Fonda RAMAN, PA-C  gabapentin  (NEURONTIN ) 100 MG capsule Take 1 capsule (100 mg total) by mouth 3 (three) times daily. 04/18/24   Franklyn Sid SAILOR, MD  ibuprofen  (ADVIL ) 800 MG tablet Take 1 tablet (800 mg total) by mouth 3 (three) times daily. 04/18/24   Franklyn Sid SAILOR, MD  methocarbamol  (ROBAXIN ) 500 MG tablet Take 1 tablet (500 mg total) by mouth 2 (two) times daily. 04/18/24   Franklyn Sid SAILOR, MD  oxyCODONE  (ROXICODONE ) 5 MG immediate release tablet Take 1 tablet (5 mg total) by mouth every 6 (six) hours as needed for up to 3 days for severe pain (pain score 7-10). 04/18/24 04/21/24  Franklyn Sid SAILOR, MD   predniSONE  (DELTASONE ) 10 MG tablet Take 4 tablets (40 mg total) by mouth daily. 04/16/24   Bundy, Joshua S, PA-C  VENTOLIN  HFA 108 (90 Base) MCG/ACT inhaler Inhale 1-2 puffs into the lungs every 4 (four) hours as needed for wheezing or shortness of breath. 03/26/24   [provider]  Vitamin D , Ergocalciferol , (DRISDOL ) 1.25 MG (50000 UNIT) CAPS capsule Take 50,000 Units by mouth every Wednesday. 12/09/21   [provider]    Allergies: Patient has no known allergies.    Review of Systems  Cardiovascular:  Positive for chest pain.    Updated Vital Signs BP (!) 141/95 (BP Location: Left Arm)   Pulse 72   Temp 98.6 F (37 C)   Resp 20   SpO2 93%   Physical Exam Vitals and nursing note reviewed.  Constitutional:      General: She is not in acute distress.    Appearance: She is well-developed.  HENT:     Head: Normocephalic and atraumatic.  Eyes:     Conjunctiva/sclera: Conjunctivae normal.  Cardiovascular:     Rate and Rhythm: Normal rate and regular rhythm.     Heart sounds: No murmur heard. Pulmonary:     Effort: Pulmonary effort is normal. No respiratory distress.     Breath sounds: Normal breath sounds.  Abdominal:  Palpations: Abdomen is soft.     Tenderness: There is no abdominal tenderness.  Musculoskeletal:        General: No swelling.     Cervical back: Neck supple.  Skin:    General: Skin is warm and dry.     Capillary Refill: Capillary refill takes less than 2 seconds.  Neurological:     Mental Status: She is alert.     Comments: RUE: Slightly decreased grip strength when compared to the left.  Pain to palpation along the right trapezius, shoulder, biceps.  Patient has 2+ radial pulses.  Equal strength with both extension and flexion of the right elbow when compared to the left.  Range of motion of the right shoulder limited due to pain.  Psychiatric:        Mood and Affect: Mood normal.     (all labs ordered are listed, but only abnormal  results are displayed) Labs Reviewed  BASIC METABOLIC PANEL WITH GFR - Abnormal; Notable for the following components:      Result Value   Chloride 112 (*)    CO2 21 (*)    All other components within normal limits  CBC - Abnormal; Notable for the following components:   RBC 5.53 (*)    All other components within normal limits  HCG, SERUM, QUALITATIVE  TROPONIN I (HIGH SENSITIVITY)  TROPONIN I (HIGH SENSITIVITY)    EKG: EKG Interpretation Date/Time:  Friday April 18 2024 11:50:17 EDT Ventricular Rate:  79 PR Interval:  122 QRS Duration:  76 QT Interval:  360 QTC Calculation: 412 R Axis:   -26  Text Interpretation: Normal sinus rhythm with sinus arrhythmia Normal ECG No previous ECGs available Confirmed by Simon Rea [phone]) on 04/18/2024 1:53:37 PM  Radiology: [facility] Chest 2 View Result Date: 04/18/2024 CLINICAL DATA:  Chest pain for 2 days. EXAM: CHEST - 2 VIEW COMPARISON:  March 29, 2017. FINDINGS: The heart size and mediastinal contours are within normal limits. Both lungs are clear. The visualized skeletal structures are unremarkable. IMPRESSION: No active cardiopulmonary disease. Electronically Signed   By: Lynwood Landy Raddle M.D.   On: 04/18/2024 12:17     Procedures   Medications Ordered in the ED  ketorolac  (TORADOL ) 15 MG/ML injection 15 mg (15 mg Intramuscular Given 04/18/24 1421)  cyclobenzaprine  (FLEXERIL ) tablet 5 mg (5 mg Oral Given 04/18/24 1421)  oxyCODONE -acetaminophen  (PERCOCET/ROXICET) 5-325 MG per tablet 1 tablet (1 tablet Oral Given 04/18/24 1551)    Clinical Course as of 04/19/24 0750  Fri Apr 18, 2024  1543 Troponin I (High Sensitivity): 8 Neg trop [HN]  1554 Discussed extensively with the patient and her husband.  Patient has been in tears and has not slept due to pain for 3 days.  They tried calling a neurosurgeon but cannot get an appointment for several months.  Off going provider had prescribed ibuprofen  as well as gabapentin  100 mg 3 times daily.  I will  add Robaxin  500 mg twice per day as well as oxycodone  5 mg every 6-8 hours as needed for 3 days.  I discussed adjuncts including Tylenol  as well 1000 mg 3 times a day.  Discussed stretching, heat, and massage which may also help.  I discussed taking a laxative while taking the oxycodone  and being cautious while driving or operating machinery.  Patient and her husband report understanding and will reach out again to neurosurgery on Monday morning.  Given discharge instructions and return precautions, all questions answered to their satisfaction. [HN]  Clinical Course User Index [HN] Franklyn Sid SAILOR, MD                                 Medical Decision Making Amount and/or Complexity of Data Reviewed Labs: ordered. Decision-making details documented in ED Course. Radiology: ordered.  Risk Prescription drug management.   Presents because of right arm pain, neck pain.  Chief complaint was chief complaint as well.  Speaks the patient, she is not any kind of chest pain.  More associated with the right shoulder.  No clear chest pain.  No hemoptysis.  No exertional chest pain.  Denies any cardiac history.  Denies any history of DVT or PE.  Patient does state that she has ongoing pain in the right trapezius area.  Right shoulder.  She endorse some sensory changes down her right arm at times.  No bowel or bladder incontinence.  No numbness or weakness of the left hand.  Has not been dropping any objects with either hand.  No fever no chills.  No difficulty walking.  Previous medical history reviewed : Patient was last seen in the ED on April 16, 2024.  MRI showed disc bulge, some pro sizable broad-based right central 4 minimal disc protrusion.  Mild bilateral canal stenosis.   RUE: Slightly decreased grip strength when compared to the left.  Pain to palpation along the right trapezius, shoulder, biceps.  Patient has 2+ radial pulses.  Equal strength with both extension and flexion of the right elbow  when compared to the left.  Range of motion of the right shoulder limited due to pain.   Terms of cardiac workup.  Patient had troponin obtained in triage.  Initial troponin was unremarkable.  EKG reviewed by me.  No T abnormality concerning for STEMI.  No arrhythmia.  Pending repeat troponin at time of signout.  No concerns for encounter cardiac pathology at this point time.   As the right upper extremity, exam is consistent for C7 radiculopathy.  Ordered gabapentin  as well as a hand milligrams ibuprofen  for home use.  She will need to follow-up with neurosurgery.  Once again, no concerns for an Central cord syndrome.  No bilateral hand numbness or grip strength abnormality.  No bowel or bladder incontinence.  No difficulty walking.  Located on unilateral side.  No indication for repeat imaging.      Final diagnoses:  Cervical herniated disc  Cervical radiculopathy at C7    ED Discharge Orders          Ordered    gabapentin  (NEURONTIN ) 100 MG capsule  3 times daily,   Status:  Discontinued        04/18/24 1452    ibuprofen  (ADVIL ) 800 MG tablet  3 times daily,   Status:  Discontinued        04/18/24 1452    methocarbamol  (ROBAXIN ) 500 MG tablet  2 times daily,   Status:  Discontinued        04/18/24 1554    oxyCODONE  (ROXICODONE ) 5 MG immediate release tablet  Every 6 hours PRN,   Status:  Discontinued        04/18/24 1554    oxyCODONE  (ROXICODONE ) 5 MG immediate release tablet  Every 6 hours PRN        04/18/24 1558    methocarbamol  (ROBAXIN ) 500 MG tablet  2 times daily        04/18/24 1558    ibuprofen  (  ADVIL ) 800 MG tablet  3 times daily        04/18/24 1558    gabapentin  (NEURONTIN ) 100 MG capsule  3 times daily        04/18/24 1558               Simon Lavonia SAILOR, MD 04/19/24 [phone]

## 2024-04-18 NOTE — ED Provider Notes (Signed)
 3:55 PM Assumed care of patient from off-going team. For more details, please see note from same day.  In brief, this is a 48 y.o. female w/ CP/UE pain. Radicular. 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 Does endorse some sensory complaints on that side.   Plan/Dispo at time of sign-out & ED Course since sign-out: [ ]  2nd trop, f/u w/ NSGY o/p  BP (!) 141/95 (BP Location: Left Arm)   Pulse 72   Temp 98.6 F (37 C)   Resp 20   SpO2 93%    ED Course:   Clinical Course as of 04/18/24 1555  Fri Apr 18, 2024  1543 Troponin I (High Sensitivity): 8 Neg trop [HN]  1554 Discussed extensively with the patient and her husband.  Patient has been in tears and has not slept due to pain for 3 days.  They tried calling a neurosurgeon but cannot get an appointment for several months.  Off going provider had prescribed ibuprofen  as well as gabapentin 100 mg 3 times daily.  I will add Robaxin 500 mg twice per day as well as oxycodone 5 mg every 6-8 hours as needed for 3 days.  I discussed adjuncts including Tylenol  as well 1000 mg 3 times a day.  Discussed stretching, heat, and massage which may also help.  I discussed taking a laxative while taking the oxycodone and being cautious while driving or operating machinery.  Patient and her husband report understanding and will reach out again to neurosurgery on Monday morning.  Given discharge instructions and return precautions, all questions answered to their satisfaction. [HN]    Clinical Course User Index [HN] Franklyn Sid SAILOR, MD    Dispo:  ------------------------------- Sid Franklyn, MD Emergency Medicine  This note was created using dictation software, which may contain spelling or grammatical errors.   Franklyn Sid SAILOR, MD 04/18/24 [phone]

## 2024-04-18 NOTE — Discharge Instructions (Addendum)
 Please follow-up with spine surgery regarding your ongoing pain.  You can call them to make an appointment. You do have a disc herniation pressing on the nerve root.  This is what is causing your discomfort.   If you ever have any kind of bowel bladder incontinence, difficulty walking because of numbness, with your hands being numb or weak at the same time then please come back to the ED for further evaluation.  You can take tylenol  1,000 mg three times per day for pain.  Massage, stretching, and heat can also help your pain.  Also, we have prescribed: -Gabapentin 100 mg three times per day for nerve pain -Robaxin 500 mg twice per day for muscle spasms -Advil  800 mg three times per day for pain/inflammation -Oxycodone 5 mg every 4-6 hours as needed for breakthrough pain  **When taking oxycodone, and opiate pain medication, please take MiraLAX 1-2 capfuls per day to help prevent constipation and stay well-hydrated.  Please also do not drive or operate machinery while taking this medication as it can cause drowsiness.    Por favor, consulte con un cirujano de columna si tiene dolor persistente. Puede llamarlos para programar una cita. Tiene una hernia discal que presiona la raz nerviosa. Esto es lo que le causa molestias.  Si alguna vez presenta incontinencia urinaria, dificultad para caminar debido al entumecimiento, y adems tiene las manos entumecidas o dbiles, regrese a urgencias para una evaluacin ms detallada.  Puede tomar Tylenol  1000 mg tres veces al da para Chief Technology Officer. [place], estiramientos y Airline pilot tambin pueden aliviar el dolor. Adems, le hemos recetado: - Gabapentina 100 mg tres veces al da para el dolor nervioso - Robaxin 500 mg dos veces al da para espasmos musculares - Advil  800 mg tres veces al da para el dolor/inflamacin - Oxicodona 5 mg cada 4-6 horas, segn sea necesario, para el dolor irruptivo  **Si toma oxicodona y analgsicos opiceos, tome MiraLAX de 1 a 2  tapones al da para ayudar a Freight forwarder estreimiento y Research scientist (life sciences) bien hidratado. No conduzca ni opere maquinaria mientras est tomando [employer], ya que puede causar somnolencia.

## 2024-04-18 NOTE — ED Triage Notes (Signed)
 Pt came in here d/t Rt sided CP that started 2 days ago & it feels like stabbing pain that radiates into her Rt arm & back of neck. Does intermittently feel SOB, was seen at [facility] for same & nothing was found (per family at bedside). A/Ox4, rates her pain 10/10 during triage.

## 2024-04-18 NOTE — ED Notes (Signed)
 Pt refused pain meds in triage, stated what I had to offer would not help & wants to wait until she gets into a room for something stronger.

## 2024-04-22 ENCOUNTER — Other Ambulatory Visit: Payer: Self-pay | Admitting: Neurosurgery

## 2024-04-23 ENCOUNTER — Other Ambulatory Visit: Payer: Self-pay

## 2024-04-23 ENCOUNTER — Encounter (HOSPITAL_COMMUNITY): Payer: Self-pay | Admitting: Neurosurgery

## 2024-04-23 NOTE — Progress Notes (Signed)
 PCP - Dr. Haku Kahoano [facility]- pt says she only sees cardiology for HTN and does not take BP medication  PPM/ICD - denies   Chest x-ray - 04/18/24 EKG - 04/18/24 Stress Test - denies ECHO - denies Cardiac Cath - denies  CPAP - denies  DM- denies  ASA/Blood Thinner Instructions: n/a   ERAS Protcol - no, NPO  COVID TEST- n/a  Anesthesia review: yes, recent ED visit. Sees cardiology, records requested  Patient verbally denies any shortness of breath, fever, cough and chest pain during phone call      Questions were answered. Patient verbalized understanding of instructions.

## 2024-04-23 NOTE — Pre-Procedure Instructions (Signed)
-------------    SDW INSTRUCTIONS given:  Your procedure is scheduled on 8/8.  Report to [facility] Main Entrance A at 05:30 A.M., and check in at the Admitting office.  Any questions or running late day of surgery: call [phone]    Remember:  Do not eat or drink after midnight the night before your surgery     Take these medicines the morning of surgery with A SIP OF WATER  gabapentin , methocarbamol       ventolin  PRN   As of today, STOP taking any Aspirin (unless otherwise instructed by your surgeon) Aleve, Naproxen, Ibuprofen , Motrin , Advil , Goody's, BC's, all herbal medications, fish oil, and all vitamins.   Do NOT Smoke (Tobacco/Vaping) 24 hours prior to your procedure  If you use a CPAP at night, you may bring all equipment for your overnight stay.     You will be asked to remove any contacts, glasses, piercing's, hearing aid's, dentures/partials prior to surgery. Please bring cases for these items if needed.     Patients discharged the day of surgery will not be allowed to drive home, and someone needs to stay with them for 24 hours.  SURGICAL WAITING ROOM VISITATION Patients may have no more than 2 support people in the waiting area - these visitors may rotate.   Pre-op nurse will coordinate an appropriate time for 1 ADULT support person, who may not rotate, to accompany patient in pre-op.  Children under the age of 74 must have an adult with them who is not the patient and must remain in the main waiting area with an adult.  If the patient needs to stay at the hospital during part of their recovery, the visitor guidelines for inpatient rooms apply.  Please refer to the [facility] website for the visitor guidelines for any additional information.   Special instructions:   [facility]- Preparing For Surgery   Please follow these instructions carefully.   Shower the NIGHT BEFORE SURGERY and the MORNING OF SURGERY with DIAL Soap.   Pat yourself dry with a CLEAN  TOWEL.  Wear CLEAN PAJAMAS to bed the night before surgery  Place CLEAN SHEETS on your bed the night of your first shower and DO NOT SLEEP WITH PETS.   Additional instructions for the day of surgery: DO NOT APPLY any lotions, deodorants, cologne, or perfumes.   Do not wear jewelry or makeup Do not wear nail polish, gel polish, artificial nails, or any other type of covering on natural nails (fingers and toes) Do not bring valuables to the hospital. [facility] is not responsible for valuables/personal belongings. Put on clean/comfortable clothes.  Please brush your teeth.  Ask your nurse before applying any prescription medications to the skin.

## 2024-04-24 ENCOUNTER — Encounter (HOSPITAL_COMMUNITY): Payer: Self-pay | Admitting: Neurosurgery

## 2024-04-24 NOTE — Anesthesia Preprocedure Evaluation (Signed)
 Anesthesia Evaluation  Patient identified by MRN, date of birth, ID band Patient awake    Reviewed: Allergy & Precautions, NPO status , Patient's Chart, lab work & pertinent test results  Airway Mallampati: Unable to assess  TM Distance: <3 FB Neck ROM: Limited    Dental  (+) Dental Advisory Given, Teeth Intact   Pulmonary neg shortness of breath, asthma , neg sleep apnea, neg COPD, neg recent URI   breath sounds clear to auscultation       Cardiovascular hypertension, (-) angina (-) Past MI and (-) CHF  Rhythm:Regular     Neuro/Psych  Neuromuscular disease  negative psych ROS   GI/Hepatic negative GI ROS, Neg liver ROS,,,  Endo/Other    Class 3 obesity  Renal/GU negative Renal ROS     Musculoskeletal negative musculoskeletal ROS (+)    Abdominal   Peds  Hematology negative hematology ROS (+) Lab Results      Component                Value               Date                      WBC                      10.4                04/18/2024                HGB                      14.9                04/18/2024                HCT                      45.3                04/18/2024                MCV                      81.9                04/18/2024                PLT                      242                 04/18/2024              Anesthesia Other Findings   Reproductive/Obstetrics                              Anesthesia Physical Anesthesia Plan  ASA: 3  Anesthesia Plan: General   Post-op Pain Management: Ofirmev  IV (intra-op)* and Toradol  IV (intra-op)*   Induction: Intravenous  PONV Risk Score and Plan: 4 or greater and Ondansetron , Dexamethasone  and Midazolam   Airway Management Planned: Oral ETT and Video Laryngoscope Planned  Additional Equipment: None  Intra-op Plan:   Post-operative Plan: Extubation in OR  Informed Consent: I have reviewed the patients History and Physical,  chart, labs and discussed  the procedure including the risks, benefits and alternatives for the proposed anesthesia with the patient or authorized representative who has indicated his/her understanding and acceptance.     Dental advisory given  Plan Discussed with: CRNA  Anesthesia Plan Comments: (PAT note written 04/24/2024 by Krystall Kruckenberg, PA-C.  )         Anesthesia Quick Evaluation

## 2024-04-24 NOTE — Progress Notes (Signed)
 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   History includes never smoker, HTN, asthma, cholecystectomy. Spanish speaking.   ED visit 7//30/25 for sudden onset right arm, neck and back pain since that morning. Her shoulder locked up in the shower with some relief if she flexed her neck forward. She had a shooting pain down her cervical spine on palpation. MRI showed disc bulge at C6-7 resulting in mild spinal canal stenosis and mildly flattening of the ventral aspect of the spinal cord and severe narrowing of the right foraminal entry zone encroaching on the right C7 nerve root. She was prescribed prednisone  and Voltaren  gel and advised neurology follow-up. She returned to ED on 04/18/24 right arm, right chest, and back of neck pain. She had not slept in about 3 days due to pain. She told provider no real chest pain, primarily right shoulder pain with some paresthesias and slight decrease in grip on the right. Bowel and bladder intact. They had tried getting a neurosurgery appointment but not scheduled for several months. Troponin x2 were negative. EKG showed SR with sinus arrhythmia. Ibuprofen , Tylenol , gabapentin , Robaxin , and as needed oxycodone  also discussed for pain management until she could follow-up with neurosurgery.    She is now scheduled for above procedure.   She had labs and BMP and CBC on 04/18/24. Anesthesia team to evaluate on the day of surgery.    VS: Ht 5' 1 (1.549 m)   Wt 98.4 kg   LMP 04/04/2023 Comment: After 2 years came back july 17  BMI 40.99 kg/m  BP Readings from Last 3  Encounters:  04/18/24 (!) 141/95  04/16/24 (!) 151/94  04/23/23 132/81   Pulse Readings from Last 3 Encounters:  04/18/24 72  04/16/24 83  04/23/23 76    PROVIDERS: Kahoano, Haku K, MD is PCP [facility]) - Geofm Kent, MD is pulmonologist [facility]). 03/26/24 visit for asthma and GERD scanned under Media tab.  - She reported seeing cardiologist Licia Cassis, MD at [facility] for HTN, but denied prior cardiac testing and is not currently taking any antihypertensives.    LABS: Labs in [facility] as of 04/18/24 include: Lab Results  Component Value Date   WBC 10.4 04/18/2024   HGB 14.9 04/18/2024   HCT 45.3 04/18/2024   PLT 242 04/18/2024   GLUCOSE 90 04/18/2024   ALT 33 01/10/2023   AST 23 01/10/2023   NA 141 04/18/2024   K 3.7 04/18/2024   CL 112 (H) 04/18/2024   CREATININE 0.70 04/18/2024   BUN 15 04/18/2024   CO2 21 (L) 04/18/2024   TSH 1.340 12/20/2018    IMAGES: CXR 04/18/24: FINDINGS: The heart size and mediastinal contours are within normal limits. Both lungs are clear. The visualized skeletal structures are unremarkable IMPRESSION: No active cardiopulmonary disease.  MRI C-spine 04/16/24: IMPRESSION: 1. Cervical spondylosis as outlined within the body of the report. 2. At C6-C7, there is moderate disc degeneration (with degenerative endplate edema/ enhancement). Disc bulge. Superimposed sizable broad-based right center/foraminal disc protrusion. The disc protrusion results in mild spinal canal stenosis (effacing the  ventral thecal sac on the right and mildly flattening the ventral aspect of the spinal cord). The disc protrusion also severely narrows the right foraminal entry zone and encroaches upon the exiting right C7 nerve root. Correlate for right C7 radiculopathy. 3. No significant spinal canal or foraminal stenosis at the remaining levels. 4. Mild grade 1 anterolisthesis at C3-C4, C4-C5 and C5-C6.     EKG: 04/18/24: Sinus rhythm with  sinus arrhythmia.  Baseline wanderer.   CV: N/A. Possible TTE at [facility] > 5 years ago.   Past Medical History:  Diagnosis Date   Asthma    Hypertension    Vaginal Pap smear, abnormal     Past Surgical History:  Procedure Laterality Date   BIOPSY  02/20/2023   Procedure: BIOPSY;  Surgeon: Cindie Carlin POUR, DO;  Location: [facility];  Service: Endoscopy;;   BREAST BIOPSY     CESAREAN SECTION     x2   COLONOSCOPY  04/05/2021   Dr. Ernesto; 3 tubular adenomas, 2 hyperplastic polyps.  Recommended 3-year surveillance.   COLONOSCOPY WITH PROPOFOL  N/A 02/20/2023   Dr. Cindie; Nonbleeding internal hemorrhoids, normal TI, 4 mm polyp in the sigmoid colon removed, 5 mm polyp in the ascending colon removed.  Pathology with tubular adenomas.  Recommended repeat colonoscopy in 5 years.   ESOPHAGOGASTRODUODENOSCOPY (EGD) WITH PROPOFOL  N/A 02/20/2023   Procedure: ESOPHAGOGASTRODUODENOSCOPY (EGD) WITH PROPOFOL ;  Surgeon: Cindie Carlin POUR, DO;  Location: [facility];  Service: Endoscopy;  Laterality: N/A;   GALLBLADDER SURGERY     TUBAL LIGATION      MEDICATIONS: No current facility-administered medications for this encounter.    diclofenac  Sodium (VOLTAREN ) 1 % GEL   gabapentin  (NEURONTIN ) 100 MG capsule   ibuprofen  (ADVIL ) 800 MG tablet   methocarbamol  (ROBAXIN ) 500 MG tablet   VENTOLIN  HFA 108 (90 Base) MCG/ACT inhaler   Vitamin D , Ergocalciferol , (DRISDOL ) 1.25 MG (50000 UNIT) CAPS capsule   predniSONE  (DELTASONE ) 10 MG tablet    Isaiah Ruder, PA-C Surgical Short Stay/Anesthesiology [facility] Phone [phone] [facility] Phone [phone] 04/24/2024 10:05 AM

## 2024-04-25 ENCOUNTER — Other Ambulatory Visit: Payer: Self-pay

## 2024-04-25 ENCOUNTER — Encounter (HOSPITAL_COMMUNITY): Payer: Self-pay | Admitting: Neurosurgery

## 2024-04-25 ENCOUNTER — Ambulatory Visit (HOSPITAL_BASED_OUTPATIENT_CLINIC_OR_DEPARTMENT_OTHER): Payer: Self-pay | Admitting: Vascular Surgery

## 2024-04-25 ENCOUNTER — Ambulatory Visit (HOSPITAL_COMMUNITY)

## 2024-04-25 ENCOUNTER — Ambulatory Visit (HOSPITAL_COMMUNITY)
Admission: RE | Disposition: A | Discharge status: Home / Self Care | Payer: Self-pay | Source: Home / Self Care | Attending: Neurosurgery

## 2024-04-25 ENCOUNTER — Ambulatory Visit (HOSPITAL_COMMUNITY): Payer: Self-pay | Admitting: Vascular Surgery

## 2024-04-25 ENCOUNTER — Ambulatory Visit (HOSPITAL_COMMUNITY)
Admission: RE | Admit: 2024-04-25 | Discharge: 2024-04-26 | Disposition: A | Discharge status: Home / Self Care | Attending: Neurosurgery | Admitting: Neurosurgery

## 2024-04-25 DIAGNOSIS — I10 Essential (primary) hypertension: Secondary | ICD-10-CM | POA: Diagnosis not present

## 2024-04-25 DIAGNOSIS — M5020 Other cervical disc displacement, unspecified cervical region: Secondary | ICD-10-CM | POA: Diagnosis present

## 2024-04-25 DIAGNOSIS — E66813 Obesity, class 3: Secondary | ICD-10-CM | POA: Diagnosis not present

## 2024-04-25 DIAGNOSIS — Z6841 Body Mass Index (BMI) 40.0 and over, adult: Secondary | ICD-10-CM | POA: Insufficient documentation

## 2024-04-25 DIAGNOSIS — J45909 Unspecified asthma, uncomplicated: Secondary | ICD-10-CM | POA: Insufficient documentation

## 2024-04-25 DIAGNOSIS — M50223 Other cervical disc displacement at C6-C7 level: Secondary | ICD-10-CM

## 2024-04-25 DIAGNOSIS — M5000 Cervical disc disorder with myelopathy, unspecified cervical region: Secondary | ICD-10-CM | POA: Diagnosis present

## 2024-04-25 DIAGNOSIS — M50123 Cervical disc disorder at C6-C7 level with radiculopathy: Secondary | ICD-10-CM | POA: Diagnosis not present

## 2024-04-25 HISTORY — DX: Unspecified asthma, uncomplicated: J45.909

## 2024-04-25 LAB — SURGICAL PCR SCREEN
MRSA, PCR: NEGATIVE
Staphylococcus aureus: NEGATIVE

## 2024-04-25 SURGERY — ANTERIOR CERVICAL DECOMPRESSION/DISCECTOMY FUSION 1 LEVEL
Anesthesia: General

## 2024-04-25 MED ORDER — PREDNISONE 20 MG PO TABS: 40.0000 mg | ORAL_TABLET | Freq: Every day | ORAL | Status: DC

## 2024-04-25 MED ORDER — CHLORHEXIDINE GLUCONATE 0.12 % MT SOLN: 15.0000 mL | Freq: Once | OROMUCOSAL | Status: AC

## 2024-04-25 MED ORDER — ACETAMINOPHEN 650 MG RE SUPP: 650.0000 mg | RECTAL | Status: DC | PRN

## 2024-04-25 MED ORDER — GABAPENTIN 100 MG PO CAPS
100.0000 mg | ORAL_CAPSULE | Freq: Three times a day (TID) | ORAL | Status: DC
  Administered 2024-04-25 (×3): 100 mg via ORAL
  Filled 2024-04-25 (×3): qty 1

## 2024-04-25 MED ORDER — SENNA 8.6 MG PO TABS
1.0000 | ORAL_TABLET | Freq: Two times a day (BID) | ORAL | Status: DC | PRN
  Administered 2024-04-25: 8.6 mg via ORAL
  Filled 2024-04-25: qty 1

## 2024-04-25 MED ORDER — ALBUTEROL SULFATE (2.5 MG/3ML) 0.083% IN NEBU: 3.0000 mL | INHALATION_SOLUTION | RESPIRATORY_TRACT | Status: DC | PRN

## 2024-04-25 MED ORDER — CYCLOBENZAPRINE HCL 10 MG PO TABS
10.0000 mg | ORAL_TABLET | Freq: Three times a day (TID) | ORAL | Status: DC | PRN
  Administered 2024-04-25 – 2024-04-26 (×2): 10 mg via ORAL
  Filled 2024-04-25 (×2): qty 1

## 2024-04-25 MED ORDER — KETAMINE HCL 50 MG/5ML IJ SOSY
PREFILLED_SYRINGE | INTRAMUSCULAR | Status: AC
  Filled 2024-04-25: qty 5

## 2024-04-25 MED ORDER — CEFAZOLIN SODIUM-DEXTROSE 2-4 GM/100ML-% IV SOLN
2.0000 g | Freq: Three times a day (TID) | INTRAVENOUS | Status: AC
  Administered 2024-04-25 (×2): 2 g via INTRAVENOUS
  Filled 2024-04-25 (×2): qty 100

## 2024-04-25 MED ORDER — SUGAMMADEX SODIUM 200 MG/2ML IV SOLN
INTRAVENOUS | Status: DC | PRN
  Administered 2024-04-25: 200 mg via INTRAVENOUS

## 2024-04-25 MED ORDER — CHLORHEXIDINE GLUCONATE 0.12 % MT SOLN
OROMUCOSAL | Status: AC
  Administered 2024-04-25: 15 mL via OROMUCOSAL
  Filled 2024-04-25: qty 15

## 2024-04-25 MED ORDER — DEXAMETHASONE SODIUM PHOSPHATE 10 MG/ML IJ SOLN
INTRAMUSCULAR | Status: DC | PRN
  Administered 2024-04-25: 10 mg via INTRAVENOUS

## 2024-04-25 MED ORDER — KETAMINE HCL 10 MG/ML IJ SOLN
INTRAMUSCULAR | Status: DC | PRN
  Administered 2024-04-25: 10 mg via INTRAVENOUS
  Administered 2024-04-25: 30 mg via INTRAVENOUS
  Administered 2024-04-25: 10 mg via INTRAVENOUS

## 2024-04-25 MED ORDER — ACETAMINOPHEN 10 MG/ML IV SOLN
INTRAVENOUS | Status: AC
Start: 2024-04-25 — End: 2024-04-25
  Filled 2024-04-25: qty 100

## 2024-04-25 MED ORDER — PHENOL 1.4 % MT LIQD
1.0000 | OROMUCOSAL | Status: DC | PRN
  Administered 2024-04-26: 1 via OROMUCOSAL
  Filled 2024-04-25: qty 177

## 2024-04-25 MED ORDER — ONDANSETRON HCL 4 MG PO TABS: 4.0000 mg | ORAL_TABLET | Freq: Four times a day (QID) | ORAL | Status: DC | PRN

## 2024-04-25 MED ORDER — MIDAZOLAM HCL 2 MG/2ML IJ SOLN
INTRAMUSCULAR | Status: AC
  Filled 2024-04-25: qty 2

## 2024-04-25 MED ORDER — OXYCODONE HCL 5 MG PO TABS
5.0000 mg | ORAL_TABLET | Freq: Once | ORAL | Status: AC | PRN
  Administered 2024-04-25: 5 mg via ORAL

## 2024-04-25 MED ORDER — HYDROMORPHONE HCL 1 MG/ML IJ SOLN: 0.5000 mg | INTRAMUSCULAR | Status: DC | PRN

## 2024-04-25 MED ORDER — DOCUSATE SODIUM 100 MG PO CAPS
100.0000 mg | ORAL_CAPSULE | Freq: Two times a day (BID) | ORAL | Status: DC | PRN
  Administered 2024-04-25: 100 mg via ORAL
  Filled 2024-04-25: qty 1

## 2024-04-25 MED ORDER — PROPOFOL 10 MG/ML IV BOLUS
INTRAVENOUS | Status: AC
  Filled 2024-04-25: qty 20

## 2024-04-25 MED ORDER — PANTOPRAZOLE SODIUM 40 MG PO TBEC
40.0000 mg | DELAYED_RELEASE_TABLET | Freq: Every day | ORAL | Status: DC
  Administered 2024-04-25: 40 mg via ORAL
  Filled 2024-04-25: qty 1

## 2024-04-25 MED ORDER — 0.9 % SODIUM CHLORIDE (POUR BTL) OPTIME
TOPICAL | Status: DC | PRN
Start: 2024-04-25 — End: 2024-04-25
  Administered 2024-04-25 (×2): 1000 mL

## 2024-04-25 MED ORDER — VITAMIN D (ERGOCALCIFEROL) 1.25 MG (50000 UNIT) PO CAPS: 50000.0000 [IU] | ORAL_CAPSULE | ORAL | Status: DC

## 2024-04-25 MED ORDER — FENTANYL CITRATE (PF) 100 MCG/2ML IJ SOLN
25.0000 ug | INTRAMUSCULAR | Status: DC | PRN
  Administered 2024-04-25: 50 ug via INTRAVENOUS
  Administered 2024-04-25 (×2): 25 ug via INTRAVENOUS

## 2024-04-25 MED ORDER — PHENYLEPHRINE HCL (PRESSORS) 10 MG/ML IV SOLN
INTRAVENOUS | Status: DC | PRN
  Administered 2024-04-25: 80 ug via INTRAVENOUS

## 2024-04-25 MED ORDER — SODIUM CHLORIDE 0.9 % IV SOLN
250.0000 mL | INTRAVENOUS | Status: DC
  Administered 2024-04-25: 250 mL via INTRAVENOUS

## 2024-04-25 MED ORDER — METHOCARBAMOL 500 MG PO TABS
500.0000 mg | ORAL_TABLET | Freq: Two times a day (BID) | ORAL | Status: DC
  Administered 2024-04-25 (×2): 500 mg via ORAL
  Filled 2024-04-25 (×2): qty 1

## 2024-04-25 MED ORDER — OXYCODONE HCL 5 MG PO TABS
ORAL_TABLET | ORAL | Status: AC
  Filled 2024-04-25: qty 1

## 2024-04-25 MED ORDER — MENTHOL 3 MG MT LOZG
1.0000 | LOZENGE | OROMUCOSAL | Status: DC | PRN
Start: 2024-04-25 — End: 2024-04-26

## 2024-04-25 MED ORDER — ORAL CARE MOUTH RINSE: 15.0000 mL | Freq: Once | OROMUCOSAL | Status: AC

## 2024-04-25 MED ORDER — SODIUM CHLORIDE 0.9% FLUSH
3.0000 mL | Freq: Two times a day (BID) | INTRAVENOUS | Status: DC
  Administered 2024-04-25 (×2): 3 mL via INTRAVENOUS

## 2024-04-25 MED ORDER — ACETAMINOPHEN 10 MG/ML IV SOLN: 1000.0000 mg | Freq: Once | INTRAVENOUS | Status: DC | PRN

## 2024-04-25 MED ORDER — ONDANSETRON HCL 4 MG/2ML IJ SOLN
INTRAMUSCULAR | Status: DC | PRN
  Administered 2024-04-25: 4 mg via INTRAVENOUS

## 2024-04-25 MED ORDER — LIDOCAINE HCL (CARDIAC) PF 100 MG/5ML IV SOSY
PREFILLED_SYRINGE | INTRAVENOUS | Status: DC | PRN
  Administered 2024-04-25: 80 mg via INTRATRACHEAL

## 2024-04-25 MED ORDER — DEXMEDETOMIDINE HCL IN NACL 80 MCG/20ML IV SOLN
INTRAVENOUS | Status: DC | PRN
  Administered 2024-04-25 (×2): 8 ug via INTRAVENOUS
  Administered 2024-04-25: 4 ug via INTRAVENOUS

## 2024-04-25 MED ORDER — MIDAZOLAM HCL 5 MG/5ML IJ SOLN
INTRAMUSCULAR | Status: DC | PRN
Start: 2024-04-25 — End: 2024-04-25
  Administered 2024-04-25: 2 mg via INTRAVENOUS

## 2024-04-25 MED ORDER — THROMBIN 5000 UNITS EX SOLR
OROMUCOSAL | Status: DC | PRN
  Administered 2024-04-25: 5 mL via TOPICAL

## 2024-04-25 MED ORDER — SODIUM CHLORIDE 0.9% FLUSH: 3.0000 mL | INTRAVENOUS | Status: DC | PRN

## 2024-04-25 MED ORDER — FENTANYL CITRATE (PF) 250 MCG/5ML IJ SOLN
INTRAMUSCULAR | Status: DC | PRN
  Administered 2024-04-25: 100 ug via INTRAVENOUS
  Administered 2024-04-25 (×3): 50 ug via INTRAVENOUS

## 2024-04-25 MED ORDER — ONDANSETRON HCL 4 MG/2ML IJ SOLN: 4.0000 mg | Freq: Four times a day (QID) | INTRAMUSCULAR | Status: DC | PRN

## 2024-04-25 MED ORDER — OXYCODONE HCL 5 MG/5ML PO SOLN: 5.0000 mg | Freq: Once | ORAL | Status: AC | PRN

## 2024-04-25 MED ORDER — FENTANYL CITRATE (PF) 100 MCG/2ML IJ SOLN
INTRAMUSCULAR | Status: AC
  Filled 2024-04-25: qty 2

## 2024-04-25 MED ORDER — ROCURONIUM BROMIDE 100 MG/10ML IV SOLN
INTRAVENOUS | Status: DC | PRN
  Administered 2024-04-25: 20 mg via INTRAVENOUS
  Administered 2024-04-25: 80 mg via INTRAVENOUS

## 2024-04-25 MED ORDER — ACETAMINOPHEN 10 MG/ML IV SOLN
INTRAVENOUS | Status: DC | PRN
  Administered 2024-04-25: 1000 mg via INTRAVENOUS

## 2024-04-25 MED ORDER — FENTANYL CITRATE (PF) 250 MCG/5ML IJ SOLN
INTRAMUSCULAR | Status: AC
  Filled 2024-04-25: qty 5

## 2024-04-25 MED ORDER — ALUM & MAG HYDROXIDE-SIMETH 200-200-20 MG/5ML PO SUSP: 30.0000 mL | Freq: Four times a day (QID) | ORAL | Status: DC | PRN

## 2024-04-25 MED ORDER — LACTATED RINGERS IV SOLN
INTRAVENOUS | Status: DC | PRN
Start: 2024-04-25 — End: 2024-04-25

## 2024-04-25 MED ORDER — ACETAMINOPHEN 325 MG PO TABS: 650.0000 mg | ORAL_TABLET | ORAL | Status: DC | PRN

## 2024-04-25 MED ORDER — HYDROCODONE-ACETAMINOPHEN 5-325 MG PO TABS: 1.0000 | ORAL_TABLET | ORAL | Status: DC | PRN

## 2024-04-25 MED ORDER — LACTATED RINGERS IV SOLN: INTRAVENOUS | Status: DC

## 2024-04-25 MED ORDER — THROMBIN 5000 UNITS EX KIT
PACK | CUTANEOUS | Status: AC
  Filled 2024-04-25: qty 3

## 2024-04-25 MED ORDER — CEFAZOLIN SODIUM-DEXTROSE 2-3 GM-%(50ML) IV SOLR
INTRAVENOUS | Status: DC | PRN
  Administered 2024-04-25: 2 g via INTRAVENOUS

## 2024-04-25 MED ORDER — PROPOFOL 10 MG/ML IV BOLUS
INTRAVENOUS | Status: DC | PRN
  Administered 2024-04-25: 160 mg via INTRAVENOUS

## 2024-04-25 MED ORDER — THROMBIN (RECOMBINANT) 5000 UNITS EX SOLR
CUTANEOUS | Status: DC | PRN
  Administered 2024-04-25: 10 mL via TOPICAL

## 2024-04-25 MED ORDER — HYDROCODONE-ACETAMINOPHEN 5-325 MG PO TABS
2.0000 | ORAL_TABLET | ORAL | Status: DC | PRN
  Administered 2024-04-25 – 2024-04-26 (×4): 2 via ORAL
  Filled 2024-04-25 (×4): qty 2

## 2024-04-25 MED ORDER — PANTOPRAZOLE SODIUM 40 MG IV SOLR: 40.0000 mg | Freq: Every day | INTRAVENOUS | Status: DC

## 2024-04-25 SURGICAL SUPPLY — 50 items
BAG COUNTER SPONGE SURGICOUNT (BAG) ×1 IMPLANT
BAND RUBBER #18 3X1/16 STRL (MISCELLANEOUS) ×2 IMPLANT
BASKET BONE COLLECTION (BASKET) ×1 IMPLANT
BENZOIN TINCTURE PRP APPL 2/3 (GAUZE/BANDAGES/DRESSINGS) ×1 IMPLANT
BIT DRILL NEURO 2X3.1 SFT TUCH (MISCELLANEOUS) ×1 IMPLANT
BUR MATCHSTICK NEURO 3.0 LAGG (BURR) ×1 IMPLANT
CANISTER SUCTION 3000ML PPV (SUCTIONS) ×1 IMPLANT
DERMABOND ADVANCED .7 DNX12 (GAUZE/BANDAGES/DRESSINGS) IMPLANT
DRAPE C-ARM 42X72 X-RAY (DRAPES) ×2 IMPLANT
DRAPE LAPAROTOMY 100X72 PEDS (DRAPES) ×1 IMPLANT
DRAPE MICROSCOPE SLANT 54X150 (MISCELLANEOUS) ×1 IMPLANT
DRSG OPSITE POSTOP 4X6 (GAUZE/BANDAGES/DRESSINGS) IMPLANT
DURAPREP 6ML APPLICATOR 50/CS (WOUND CARE) ×1 IMPLANT
ELECT COATED BLADE 2.86 ST (ELECTRODE) ×1 IMPLANT
ELECTRODE REM PT RTRN 9FT ADLT (ELECTROSURGICAL) ×1 IMPLANT
GAUZE 4X4 16PLY ~~LOC~~+RFID DBL (SPONGE) IMPLANT
GAUZE SPONGE 4X4 12PLY STRL (GAUZE/BANDAGES/DRESSINGS) ×1 IMPLANT
GLOVE BIO SURGEON STRL SZ7 (GLOVE) ×1 IMPLANT
GLOVE BIO SURGEON STRL SZ8 (GLOVE) ×1 IMPLANT
GLOVE BIOGEL PI IND STRL 7.0 (GLOVE) ×1 IMPLANT
GLOVE EXAM NITRILE XL STR (GLOVE) IMPLANT
GLOVE INDICATOR 8.5 STRL (GLOVE) ×1 IMPLANT
GOWN STRL REUS W/ TWL LRG LVL3 (GOWN DISPOSABLE) ×1 IMPLANT
GOWN STRL REUS W/ TWL XL LVL3 (GOWN DISPOSABLE) ×1 IMPLANT
GOWN STRL REUS W/TWL 2XL LVL3 (GOWN DISPOSABLE) IMPLANT
GRAFT BNE MATRIX VG FRMBL SM 1 (Bone Implant) IMPLANT
HALTER HD/CHIN CERV TRACTION D (MISCELLANEOUS) ×1 IMPLANT
HEMOSTAT POWDER KIT SURGIFOAM (HEMOSTASIS) ×1 IMPLANT
KIT BASIN OR (CUSTOM PROCEDURE TRAY) ×1 IMPLANT
KIT TURNOVER KIT B (KITS) ×1 IMPLANT
NDL HYPO 18GX1.5 BLUNT FILL (NEEDLE) ×1 IMPLANT
NDL SPNL 20GX3.5 QUINCKE YW (NEEDLE) ×1 IMPLANT
NEEDLE HYPO 18GX1.5 BLUNT FILL (NEEDLE) ×1 IMPLANT
NEEDLE SPNL 20GX3.5 QUINCKE YW (NEEDLE) ×1 IMPLANT
NS IRRIG 1000ML POUR BTL (IV SOLUTION) ×1 IMPLANT
PACK LAMINECTOMY NEURO (CUSTOM PROCEDURE TRAY) ×1 IMPLANT
PAD ARMBOARD POSITIONER FOAM (MISCELLANEOUS) ×3 IMPLANT
PIN DISTRACTION 14MM (PIN) IMPLANT
PLATE CERV RES ACP 14 1L (Plate) IMPLANT
SCREW VA SD RESONATE 4.2X14 (Screw) IMPLANT
SPACER HEDRON C 12X14X8 0D (Spacer) IMPLANT
SPONGE INTESTINAL PEANUT (DISPOSABLE) ×1 IMPLANT
SPONGE SURGIFOAM ABS GEL SZ50 (HEMOSTASIS) ×1 IMPLANT
STRIP CLOSURE SKIN 1/2X4 (GAUZE/BANDAGES/DRESSINGS) ×1 IMPLANT
SUT VIC AB 3-0 SH 8-18 (SUTURE) ×1 IMPLANT
SUT VIC AB 4-0 PS2 18 (SUTURE) ×1 IMPLANT
TAPE CLOTH 4X10 WHT NS (GAUZE/BANDAGES/DRESSINGS) ×1 IMPLANT
TOWEL GREEN STERILE (TOWEL DISPOSABLE) ×1 IMPLANT
TOWEL GREEN STERILE FF (TOWEL DISPOSABLE) ×1 IMPLANT
WATER STERILE IRR 1000ML POUR (IV SOLUTION) ×1 IMPLANT

## 2024-04-25 NOTE — Anesthesia Procedure Notes (Signed)
 Procedure Name: Intubation Date/Time: 04/25/2024 8:00 AM  Performed by: Arvell Edsel HERO, CRNAPre-anesthesia Checklist: Patient identified, Emergency Drugs available, Suction available, Patient being monitored and Timeout performed Patient Re-evaluated:Patient Re-evaluated prior to induction Oxygen Delivery Method: Circle system utilized Preoxygenation: Pre-oxygenation with 100% oxygen Induction Type: IV induction Ventilation: Mask ventilation without difficulty and Oral airway inserted - appropriate to patient size Laryngoscope Size: Glidescope and 3 Grade View: Grade I Tube type: Oral Tube size: 7.0 mm Number of attempts: 1 Airway Equipment and Method: Video-laryngoscopy Placement Confirmation: ETT inserted through vocal cords under direct vision, positive ETCO2, breath sounds checked- equal and bilateral and CO2 detector Secured at: 23 cm Tube secured with: Tape

## 2024-04-25 NOTE — Transfer of Care (Signed)
 Immediate Anesthesia Transfer of Care Note  Patient: Chelsey Jordan  Procedure(s) Performed: ANTERIOR CERVICAL DECOMPRESSION/DISCECTOMY FUSION CERVICAL SIX-CERVICAL SEVEN  Patient Location: PACU  Anesthesia Type:General  Level of Consciousness: drowsy and patient cooperative  Airway & Oxygen Therapy: Patient Spontanous Breathing and Patient connected to face mask oxygen  Post-op Assessment: Report given to RN, Post -op Vital signs reviewed and stable, Post -op Vital signs reviewed and unstable, Anesthesiologist notified, and Patient moving all extremities X 4  Post vital signs: Reviewed and stable  Last Vitals:  Vitals Value Taken Time  BP 136/86 04/25/24 09:45  Temp 37 C 04/25/24 09:41  Pulse 84 04/25/24 09:46  Resp 29 04/25/24 09:46  SpO2 94 % 04/25/24 09:46  Vitals shown include unfiled device data.  Last Pain:  Vitals:   04/25/24 0941  TempSrc:   PainSc: 0-No pain         Complications: No notable events documented.

## 2024-04-25 NOTE — H&P (Signed)
 Chelsey Jordan is an 48 y.o. female.   Chief Complaint: Neck and right arm pain HPI: 48 year old female with severe neck and right arm pain workup revealed large disc herniation C6-7 on the right displacing the spinal cord and right C7 nerve root.  Due to her progression of clinical syndrome significant weakness on clinical exam failed conservative treatment I recommended to cervical discectomy fusion at that level.  I extensively reviewed the risks and benefits of the operation with patient as well as perioperative course expectations of outcome and alternatives to surgery and she understands and agrees to proceed forward.  Past Medical History:  Diagnosis Date   Asthma    Hypertension    Vaginal Pap smear, abnormal     Past Surgical History:  Procedure Laterality Date   BIOPSY  02/20/2023   Procedure: BIOPSY;  Surgeon: Cindie Carlin POUR, DO;  [facility];  Service: Endoscopy;;   BREAST BIOPSY     CESAREAN SECTION     x2   COLONOSCOPY  04/05/2021   Dr. Ernesto; 3 tubular adenomas, 2 hyperplastic polyps.  Recommended 3-year surveillance.   COLONOSCOPY WITH PROPOFOL  N/A 02/20/2023   Dr. Cindie; Nonbleeding internal hemorrhoids, normal TI, 4 mm polyp in the sigmoid colon removed, 5 mm polyp in the ascending colon removed.  Pathology with tubular adenomas.  Recommended repeat colonoscopy in 5 years.   ESOPHAGOGASTRODUODENOSCOPY (EGD) WITH PROPOFOL  N/A 02/20/2023   Procedure: ESOPHAGOGASTRODUODENOSCOPY (EGD) WITH PROPOFOL ;  Surgeon: Cindie Carlin POUR, DO;  [facility];  Service: Endoscopy;  Laterality: N/A;   GALLBLADDER SURGERY     TUBAL LIGATION      Family History  Problem Relation Age of Onset   Hypertension Mother    Lung disease Mother    Lupus Mother    Hypertension Father    Heart disease Father    Crohn's disease Father    Cancer - Colon Neg Hx    Social History:  reports that she has never smoked. She has never used smokeless tobacco. She reports  that she does not drink alcohol and does not use drugs.  Allergies: No Known Allergies  Medications Prior to Admission  Medication Sig Dispense Refill   diclofenac  Sodium (VOLTAREN ) 1 % GEL Apply 4 g topically 4 (four) times daily. 150 g 0   gabapentin  (NEURONTIN ) 100 MG capsule Take 1 capsule (100 mg total) by mouth 3 (three) times daily. 30 capsule 1   ibuprofen  (ADVIL ) 800 MG tablet Take 1 tablet (800 mg total) by mouth 3 (three) times daily. 21 tablet 0   methocarbamol  (ROBAXIN ) 500 MG tablet Take 1 tablet (500 mg total) by mouth 2 (two) times daily. 20 tablet 0   omeprazole  (PRILOSEC) 40 MG capsule Take 40 mg by mouth daily.     VENTOLIN  HFA 108 (90 Base) MCG/ACT inhaler Inhale 1-2 puffs into the lungs every 4 (four) hours as needed for wheezing or shortness of breath.     Vitamin D , Ergocalciferol , (DRISDOL ) 1.25 MG (50000 UNIT) CAPS capsule Take 50,000 Units by mouth every Wednesday.     predniSONE  (DELTASONE ) 10 MG tablet Take 4 tablets (40 mg total) by mouth daily. (Patient not taking: Reported on 04/22/2024) 20 tablet 0    No results found for this or any previous visit (from the past 48 hours). No results found.  Review of Systems  Musculoskeletal:  Positive for neck pain.  Neurological:  Positive for weakness and numbness.    Blood pressure (!) 141/96, pulse  73, temperature 97.8 F (36.6 C), temperature source Oral, resp. rate 20, height 5' 1 (1.549 m), weight 106.1 kg, last menstrual period 04/04/2023, SpO2 92%. Physical Exam HENT:     Head: Normocephalic.     Right Ear: Tympanic membrane normal.     Nose: Nose normal.     Mouth/Throat:     Mouth: Mucous membranes are moist.  Cardiovascular:     Rate and Rhythm: Normal rate.     Pulses: Normal pulses.  Pulmonary:     Effort: Pulmonary effort is normal.  Musculoskeletal:        General: Normal range of motion.     Cervical back: Normal range of motion.  Neurological:     Mental Status: She is alert.     Comments:  Significant weakness in right tricep 4- out of 5 otherwise both upper extremities are 5 5      Assessment/Plan 48 year old presents for anterior cervical discectomy fusion C6-7   Arley SHAUNNA Helling, MD 04/25/2024, 7:23 AM

## 2024-04-25 NOTE — Plan of Care (Signed)

## 2024-04-25 NOTE — Progress Notes (Signed)
 Orthopedic Tech Progress Note Patient Details:  Chelsey Jordan Oct 05, 1975 [id number]  Ortho Devices Type of Ortho Device: Soft collar Ortho Device/Splint Location: Neck Ortho Device/Splint Interventions: Ordered, Application   Post Interventions Patient Tolerated: Well  Ladonya Jerkins A Shawnte Winton 04/25/2024, 12:12 PM

## 2024-04-25 NOTE — Discharge Instructions (Signed)
   Wound Care Keep incision covered and dry until post op day 3. You may remove the Honeycomb dressing on post op day 3. Leave steri-strips on neck.  They will fall off by themselves. Do not put any creams, lotions, or ointments on incision. You are fine to shower. Let water run over incision and pat dry.  Activity Walk each and every day, increasing distance each day. No lifting greater than 8 lbs.  Avoid excessive neck motion. No driving, or riding a car unless coming back and forth to see the doctor  Diet Resume your normal diet.   Return to Work Will be discussed at your follow up appointment.  Call Your Doctor If Any of These Occur Redness, drainage, or swelling at the wound.  Temperature greater than 101 degrees. Severe pain not relieved by pain medication. Incision starts to come apart.  Follow Up Appt Call [phone] if you have one or any problem.

## 2024-04-25 NOTE — Op Note (Signed)
 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  Same.  Procedure: Anterior cervical discectomy and fusion at C6-7 utilizing titanium cage and anterior cervical plating with the globus resonate plating system locally harvested autograft and Vivigen.  Surgeon: Arley helling.  Anesthesia: General.  EBL: Minimal.  HPI: 48 year old female presented with severe neck and right arm pain with weakness in her right tricep workup revealed large discrimination C6-7 right and due to patient's progression of clinical syndrome imaging findings failed conservative treatment I recommended anterior cervical discectomy and fusion at that level.  I extensively reviewed the risks and benefits of that procedure with the patient as well as perioperative course expectations of outcome and alternatives to surgery and she understood and agreed to proceed forward.  We utilized an interpreter and family as the patient was minimally English-speaking.  Operative procedure patient was brought into the OR was induced under general anesthesia positioned supine the neck in slight extension 5 pounds of halter traction.  The right side of her neck was prepped and draped in routine sterile fashion.  Preoperative x-ray localized the appropriate level.  Curvilinear incision was made just off midline to the intraportal of the sternocleidomastoid intrafissural layer of platysma is dissected out divided longitudinally.  The avascular plane between the sternomastoid and strap muscle was developed down to the prevertebral fascia and prevertebral fascia was dissected away with Kitners.  Interoperative x-ray confirmed identification appropriate level.  So annulotomy was made with a 15 blade scalpel to mark the disc base longus was reflected laterally and self-retaining retractor was placed.  Anterior osteophytes were bit off with a 3 Miller Kerrison punch.  Disc base was scraped and posterior osteophytic complex was  drilled down to identify the posterior annulus and posterior lateral ligament.  Under microscopic lamination this was all under Bitton and marching laterally identified the dura and the C7 pedicle and decompressed and skeletonized the left C7 nerve root flush with pedicle then marching towards the right several large free fragments disc from or migrated into the foramen these were teased away with a black nerve hook and the foramina was decompressed the C7 nerve root was skeletonized distally to the pedicle and widely visualized.  So the N discectomy was no further stenosis all fragments of been removed there was no pressure on the thecal sac or C7 nerve root.  I then sized up an 8 mm titanium cage packed with locally harvested autograft mixed with Vivigen and inserted that into pack some additional bone graft material around it and placed a 14 mm globus resonate plate all screws had excellent purchase locking mechanism was engaged wound was then copiously irrigated meticulous hemostasis was maintained and the wound was closed in layers with interrupted Vicryl and a running 4 subcuticular.  Dermabond benzoin Steri-Strips and a sterile dressing was applied patient went to cover him in stable condition.  At the end the case all needle counts and sponge counts were correct.

## 2024-04-26 DIAGNOSIS — M50123 Cervical disc disorder at C6-C7 level with radiculopathy: Secondary | ICD-10-CM | POA: Diagnosis not present

## 2024-04-26 MED ORDER — CYCLOBENZAPRINE HCL 10 MG PO TABS: 10.0000 mg | ORAL_TABLET | Freq: Three times a day (TID) | ORAL | 0 refills | Status: DC | PRN

## 2024-04-26 MED ORDER — HYDROCODONE-ACETAMINOPHEN 5-325 MG PO TABS: 1.0000 | ORAL_TABLET | ORAL | 0 refills | Status: DC | PRN

## 2024-04-26 NOTE — Evaluation (Signed)
 Occupational Therapy Evaluation Patient Details Name: Chelsey Jordan MRN: [id number] DOB: 03/02/1976 Today's Date: 04/26/2024   History of Present Illness   Chelsey Jordan is an 48 y.o. female who is s/p C6-7 ACDF. PMHx: asthma, HTN     Clinical Impressions Chelsey Jordan was evaluated s/p the above spine surgery. She is indep at baseline. Upon evaluation pt was limited by surgical pain, cervical precautions, throat soreness, mildly unsteady gait and decreased activity tolerance. Overall she demonstrated mod I ability to complete ADLs and functional mobility without DME. Provided cues and education on spinal precautions and compensatory techniques throughout, handout provided and pt demonstrated great recall during ADLs and mobility. Pt does not require further acute OT services. Recommend d/c home with support of family.       If plan is discharge home, recommend the following:   Assistance with cooking/housework;Assist for transportation     Functional Status Assessment   Patient has had a recent decline in their functional status and demonstrates the ability to make significant improvements in function in a reasonable and predictable amount of time.     Equipment Recommendations   None recommended by OT     Precautions/Restrictions   Precautions Precautions: Fall Restrictions Weight Bearing Restrictions Per Provider Order: No     Mobility Bed Mobility Overal bed mobility: Modified Independent             General bed mobility comments: log roll    Transfers Overall transfer level: Modified independent                 General transfer comment: no AD      Balance Overall balance assessment: Mild deficits observed, not formally tested                   ADL either performed or assessed with clinical judgement   ADL Overall ADL's : Modified independent;Needs assistance/impaired       General ADL Comments: pt demonstrated mod I ability  to complete ADLs after education and review     Vision Baseline Vision/History: 1 Wears glasses Vision Assessment?: No apparent visual deficits     Perception Perception: Within Functional Limits       Praxis Praxis: WFL       Pertinent Vitals/Pain Pain Assessment Pain Assessment: Faces Faces Pain Scale: Hurts little more Pain Location: incision and throat Pain Descriptors / Indicators: Guarding Pain Intervention(s): Monitored during session, Limited activity within patient's tolerance     Extremity/Trunk Assessment Upper Extremity Assessment Upper Extremity Assessment: Overall WFL for tasks assessed (paraesthesias in the 2-3 fingers)   Lower Extremity Assessment Lower Extremity Assessment: Defer to PT evaluation   Cervical / Trunk Assessment Cervical / Trunk Assessment: Neck Surgery   Communication Communication Communication: Impaired Factors Affecting Communication: Non - English speaking, interpreter not available   Cognition Arousal: Alert Behavior During Therapy: WFL for tasks assessed/performed Cognition: No apparent impairments             OT - Cognition Comments: Pt's daughter interpreting when needed                 Following commands: Intact       Cueing  General Comments   Cueing Techniques: Verbal cues  VSS, daughter present           Home Living Family/patient expects to be discharged to:: Private residence Living Arrangements: Children Available Help at Discharge: Family;Friend(s) Type of Home: House Home Access: Stairs to enter  Home Layout: One level     Bathroom Shower/Tub: Runner, broadcasting/film/video: None          Prior Functioning/Environment Prior Level of Function : Driving;Independent/Modified Independent            OT Problem List: Decreased activity tolerance;Decreased knowledge of precautions        OT Goals(Current goals can be found in the care plan section)   Acute Rehab  OT Goals Patient Stated Goal: home OT Goal Formulation: With patient Time For Goal Achievement: 04/26/24 Potential to Achieve Goals: Good   AM-PAC OT 6 Clicks Daily Activity     Outcome Measure Help from another person eating meals?: None Help from another person taking care of personal grooming?: None Help from another person toileting, which includes using toliet, bedpan, or urinal?: None Help from another person bathing (including washing, rinsing, drying)?: None Help from another person to put on and taking off regular upper body clothing?: None Help from another person to put on and taking off regular lower body clothing?: None 6 Click Score: 24   End of Session Nurse Communication: Mobility status (pt asking for throat spray)  Activity Tolerance: Patient tolerated treatment well Patient left: in bed;with call bell/phone within reach  OT Visit Diagnosis: Unsteadiness on feet (R26.81);Other abnormalities of gait and mobility (R26.89)                Time: 9271-9251 OT Time Calculation (min): 20 min Charges:  OT General Charges $OT Visit: 1 Visit OT Evaluation $OT Eval Low Complexity: 1 Low  Lucie Kendall, OTR/L Acute Rehabilitation Services Office [phone] Secure Chat Communication Preferred   Lucie JONETTA Kendall 04/26/2024, 8:05 AM

## 2024-04-26 NOTE — Progress Notes (Signed)
 Patient alert and oriented, voiding adequately, skin clean, dry and intact without evidence of skin break down, or symptoms of complications - no redness or edema noted, only slight tenderness at site of incision.  Patient states pain is manageable at time of discharge Room was checked and accounted for all patient's belongings; discharge instructions concerning her medications, incision care, follow up appointment and when to call the doctor as needed were all discussed with patient by RN and she expressed understanding on the instructions given

## 2024-04-26 NOTE — Plan of Care (Signed)

## 2024-04-26 NOTE — Discharge Summary (Signed)
  Patient ID: Chelsey Jordan MRN: [id number] DOB/AGE: 02/13/1976 48 y.o.  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 female who was admitted following a ACDF C6-7. They were recovered in PACU and transferred to the floor. Hospital course was uncomplicated. Pt stable for discharge today. Pt to f/u in office for routine post op visit. Pt is in agreement w/ plan.    Discharge Exam: Blood pressure 137/71, pulse 75, temperature 97.6 F (36.4 C), temperature source Oral, resp. rate 19, height 5' 1 (1.549 m), weight 106.1 kg, last menstrual period 04/04/2023, SpO2 97%. A&O Speech fluent, appropriate Strength grossly intact BUE/BLE.  SILTx4.  Dressing c/d/I.   Disposition: Discharge disposition: 01-Home or Self Care       Discharge Instructions      Remove dressing in 72 hours   Complete by: As directed       Allergies as of 04/26/2024   No Known Allergies      Medication List     PAUSE taking these medications    ibuprofen  800 MG tablet Wait to take this until: May 02, 2024 Commonly known as: ADVIL  Take 1 tablet (800 mg total) by mouth 3 (three) times daily.       STOP taking these medications    methocarbamol  500 MG tablet Commonly known as: ROBAXIN    predniSONE  10 MG tablet Commonly known as: DELTASONE        TAKE these medications    cyclobenzaprine  10 MG tablet Commonly known as: FLEXERIL  Take 1 tablet (10 mg total) by mouth 3 (three) times daily as needed for muscle spasms.   diclofenac  Sodium 1 % Gel Commonly known as: Voltaren  Apply 4 g topically 4 (four) times daily.   gabapentin  100 MG capsule Commonly known as: Neurontin  Take 1 capsule (100 mg total) by mouth 3 (three) times daily.    HYDROcodone -acetaminophen  5-325 MG tablet Commonly known as: NORCO/VICODIN Take 1-2 tablets by mouth every 4 (four) hours as needed for severe pain (pain score 7-10).   omeprazole  40 MG capsule Commonly known as: PRILOSEC Take 40 mg by mouth daily.   Ventolin  HFA 108 (90 Base) MCG/ACT inhaler Generic drug: albuterol  Inhale 1-2 puffs into the lungs every 4 (four) hours as needed for wheezing or shortness of breath.   Vitamin D  (Ergocalciferol ) 1.25 MG (50000 UNIT) Caps capsule Commonly known as: DRISDOL  Take 50,000 Units by mouth every Wednesday.        Follow-up Information     Onetha Kuba, MD. Call.   Specialty: Neurosurgery Why: As needed, If symptoms worsen Contact information: [address] [place] [address] [phone]                 Signed: Irys Nigh CAYLIN Bryanne Jordan 04/26/2024, 9:02 AM

## 2024-04-27 MED FILL — Thrombin For Soln 5000 Unit: CUTANEOUS | Qty: 2 | Status: AC

## 2024-04-28 ENCOUNTER — Encounter (HOSPITAL_COMMUNITY): Payer: Self-pay | Admitting: Neurosurgery

## 2024-04-30 NOTE — Anesthesia Postprocedure Evaluation (Signed)
 Anesthesia Post Note  Patient: Chelsey Jordan  Procedure(s) Performed: ANTERIOR CERVICAL DECOMPRESSION/DISCECTOMY FUSION CERVICAL SIX-CERVICAL SEVEN     Patient location during evaluation: PACU Anesthesia Type: General Level of consciousness: awake and alert Pain management: pain level controlled Vital Signs Assessment: post-procedure vital signs reviewed and stable Respiratory status: spontaneous breathing, nonlabored ventilation and respiratory function stable Cardiovascular status: blood pressure returned to baseline and stable Postop Assessment: no apparent nausea or vomiting Anesthetic complications: no   No notable events documented.                Taniqua Issa

## 2024-05-15 ENCOUNTER — Other Ambulatory Visit: Payer: Self-pay | Admitting: Adult Medicine

## 2024-05-15 DIAGNOSIS — Z1231 Encounter for screening mammogram for malignant neoplasm of breast: Secondary | ICD-10-CM

## 2024-06-18 ENCOUNTER — Ambulatory Visit: Admitting: Internal Medicine

## 2024-06-18 ENCOUNTER — Encounter: Payer: Self-pay | Admitting: Internal Medicine

## 2024-06-18 VITALS — BP 139/85 | HR 99 | Temp 97.1°F | Ht 61.0 in | Wt 235.2 lb

## 2024-06-18 DIAGNOSIS — R140 Abdominal distension (gaseous): Secondary | ICD-10-CM

## 2024-06-18 DIAGNOSIS — R110 Nausea: Secondary | ICD-10-CM | POA: Diagnosis not present

## 2024-06-18 DIAGNOSIS — K760 Fatty (change of) liver, not elsewhere classified: Secondary | ICD-10-CM

## 2024-06-18 DIAGNOSIS — R197 Diarrhea, unspecified: Secondary | ICD-10-CM

## 2024-06-18 DIAGNOSIS — R1013 Epigastric pain: Secondary | ICD-10-CM

## 2024-06-18 DIAGNOSIS — R1319 Other dysphagia: Secondary | ICD-10-CM

## 2024-06-18 DIAGNOSIS — R1010 Upper abdominal pain, unspecified: Secondary | ICD-10-CM

## 2024-06-18 DIAGNOSIS — R1310 Dysphagia, unspecified: Secondary | ICD-10-CM

## 2024-06-18 DIAGNOSIS — Z860101 Personal history of adenomatous and serrated colon polyps: Secondary | ICD-10-CM

## 2024-06-18 MED ORDER — PANTOPRAZOLE SODIUM 40 MG PO TBEC: 40.0000 mg | DELAYED_RELEASE_TABLET | Freq: Every day | ORAL | 11 refills | Status: DC

## 2024-06-18 NOTE — Patient Instructions (Addendum)
 Le programaremos una endoscopia superior para evaluar con ms detalle su dolor abdominal, nuseas e hinchazn.  Voy a cambiarle el omeprazol a pantoprazol 40 mg al da. Este medicamento funciona mejor si se toma 30 minutos antes del desayuno.  Solicitar su FibroScan a Engineer, maintenance (IT). Despus de revisarlo, podremos decidir los prximos pasos con respecto a su hgado graso.  Fue Oncologist a verle hoy.  Dr. Cindie

## 2024-06-18 NOTE — Progress Notes (Signed)
 Referring Provider: Kahoano, Haku K, MD Primary Care Physician:  Kahoano, Haku K, MD Primary GI:  Dr. Cindie  Chief Complaint  Patient presents with   Follow-up    Patient here today due to having some issues with epigastric pain, bloating,vomiting and diarrhea. She takes omeprazole  40 mg prn, but says she does not use it daily as it causes issues with constipation.     HPI:   Chelsey Jordan is a 48 y.o. female who presents to clinic for follow-up visit.  Last seen in our office August 2024.  Patient is Spanish-speaking only, in person interpreter used.  Multiple GI complaints for me today.    Abdominal pain, nausea, bloating: Patient reports worsening abdominal pain.  Primarily epigastric, reports associated abdominal bloating.  Worse after meals.  Started approximately 2 months ago.  Also having associated nausea without vomiting.  States initially when her symptoms started she also had diarrhea but this is improved.  Has been taking omeprazole  40 mg daily as needed.  If she takes it every day then this will cause constipation.  EGD 02/20/2023: Normal esophagus, gastritis and duodenitis biopsied.  Pathology with mild reactive gastropathy, negative for H. pylori.  Duodenal biopsies benign.  Recommended PPI twice daily   Denies any NSAID use.  Occasional dysphagia as well.  Feels like food will get stuck in her substernal region.  Denies any food regurgitation.  MASLD: Patient reports history of fatty liver disease.  Most recent blood work with normal enzymes.  States she had a FibroScan done last year at [facility] although I cannot see this.  Risk factors include diabetes, obesity and (BMI 44.44).  History of colon polyps: Colonoscopy 02/20/23: Nonbleeding internal hemorrhoids, normal TI, 4 mm polyp in the sigmoid colon removed, 5 mm polyp in the ascending colon removed.  Pathology with tubular adenomas.  Recommended repeat colonoscopy in 5 years.   Past Medical History:   Diagnosis Date   Asthma    Hypertension    Vaginal Pap smear, abnormal     Past Surgical History:  Procedure Laterality Date   ANTERIOR CERVICAL DECOMP/DISCECTOMY FUSION N/A 04/25/2024   Procedure: ANTERIOR CERVICAL DECOMPRESSION/DISCECTOMY FUSION CERVICAL SIX-CERVICAL SEVEN;  Surgeon: Onetha Kuba, MD;  Location: [facility] OR;  Service: Neurosurgery;  Laterality: N/A;   BIOPSY  02/20/2023   Procedure: BIOPSY;  Surgeon: Cindie Carlin POUR, DO;  Location: [facility];  Service: Endoscopy;;   BREAST BIOPSY     CESAREAN SECTION     x2   COLONOSCOPY  04/05/2021   Dr. Ernesto; 3 tubular adenomas, 2 hyperplastic polyps.  Recommended 3-year surveillance.   COLONOSCOPY WITH PROPOFOL  N/A 02/20/2023   Dr. Cindie; Nonbleeding internal hemorrhoids, normal TI, 4 mm polyp in the sigmoid colon removed, 5 mm polyp in the ascending colon removed.  Pathology with tubular adenomas.  Recommended repeat colonoscopy in 5 years.   ESOPHAGOGASTRODUODENOSCOPY (EGD) WITH PROPOFOL  N/A 02/20/2023   Procedure: ESOPHAGOGASTRODUODENOSCOPY (EGD) WITH PROPOFOL ;  Surgeon: Cindie Carlin POUR, DO;  Location: [facility];  Service: Endoscopy;  Laterality: N/A;   GALLBLADDER SURGERY     TUBAL LIGATION      Current Outpatient Medications  Medication Sig Dispense Refill   glipiZIDE 2.5 MG TABS Take 1 tablet by mouth daily.     methylPREDNISolone (MEDROL DOSEPAK) 4 MG TBPK tablet daily at 6 (six) AM.     omeprazole  (PRILOSEC) 40 MG capsule Take 40 mg by mouth daily. (Patient taking differently: Take 40 mg by mouth as  needed.)     SYMBICORT 160-4.5 MCG/ACT inhaler Inhale 2 puffs into the lungs in the morning and at bedtime.     VENTOLIN  HFA 108 (90 Base) MCG/ACT inhaler Inhale 1-2 puffs into the lungs every 4 (four) hours as needed for wheezing or shortness of breath.     Vitamin D , Ergocalciferol , (DRISDOL ) 1.25 MG (50000 UNIT) CAPS capsule Take 50,000 Units by mouth every Wednesday.     No current facility-administered medications  for this visit.    Allergies as of 06/18/2024   (No Known Allergies)    Family History  Problem Relation Age of Onset   Hypertension Mother    Lung disease Mother    Lupus Mother    Hypertension Father    Heart disease Father    Crohn's disease Father    Cancer - Colon Neg Hx     Social History   Socioeconomic History   Marital status: Single    Spouse name: Not on file   Number of children: Not on file   Years of education: Not on file   Highest education level: Not on file  Occupational History   Not on file  Tobacco Use   Smoking status: Never   Smokeless tobacco: Never  Vaping Use   Vaping status: Never Used  Substance and Sexual Activity   Alcohol use: No   Drug use: No   Sexual activity: Not Currently    Partners: Male    Birth control/protection: Surgical  Other Topics Concern   Not on file  Social History Narrative   Not on file   Social Drivers of Health   Financial Resource Strain: Not on file  Food Insecurity: Not on file  Transportation Needs: Not on file  Physical Activity: Not on file  Stress: Not on file  Social Connections: Not on file    Subjective: Review of Systems  Constitutional:  Negative for chills and fever.  HENT:  Negative for congestion and hearing loss.   Eyes:  Negative for blurred vision and double vision.  Respiratory:  Negative for cough and shortness of breath.   Cardiovascular:  Negative for chest pain and palpitations.  Gastrointestinal:  Negative for abdominal pain, blood in stool, constipation, diarrhea, heartburn, melena and vomiting.  Genitourinary:  Negative for dysuria and urgency.  Musculoskeletal:  Negative for joint pain and myalgias.  Skin:  Negative for itching and rash.  Neurological:  Negative for dizziness and headaches.  Psychiatric/Behavioral:  Negative for depression. The patient is not nervous/anxious.      Objective: BP 139/85 (BP Location: Left Arm, Patient Position: Sitting, Cuff Size:  Large)   Pulse 99   Temp (!) 97.1 F (36.2 C) (Temporal)   Ht 5' 1 (1.549 m)   Wt 235 lb 3.2 oz (106.7 kg)   LMP 04/04/2023 Comment: After 2 years came back july 17  BMI 44.44 kg/m  Physical Exam Constitutional:      Appearance: Normal appearance.  HENT:     Head: Normocephalic and atraumatic.  Eyes:     Extraocular Movements: Extraocular movements intact.     Conjunctiva/sclera: Conjunctivae normal.  Cardiovascular:     Rate and Rhythm: Normal rate and regular rhythm.  Pulmonary:     Effort: Pulmonary effort is normal.     Breath sounds: Normal breath sounds.  Abdominal:     General: Bowel sounds are normal.     Palpations: Abdomen is soft.  Musculoskeletal:        General: No swelling.  Normal range of motion.     Cervical back: Normal range of motion and neck supple.  Skin:    General: Skin is warm and dry.     Coloration: Skin is not jaundiced.  Neurological:     General: No focal deficit present.     Mental Status: She is alert and oriented to person, place, and time.  Psychiatric:        Mood and Affect: Mood normal.        Behavior: Behavior normal.      Assessment/Plan:  1.  Abdominal pain, nausea, bloating, esophageal dysphagia- Will schedule for EGD with possible dilation to evaluate for peptic ulcer disease, esophagitis, gastritis, H. Pylori, duodenitis, or other. Will also evaluate for esophageal stricture, Schatzki's ring, esophageal web or other.   The risks including infection, bleed, or perforation as well as benefits, limitations, alternatives and imponderables have been reviewed with the patient. Potential for esophageal dilation, biopsy, etc. have also been reviewed.  Questions have been answered. All parties agreeable.  Will change omeprazole  to pantoprazole  40 mg daily.  2.  MASLD-based on blood work last year, FIB4 0.68 (advanced fibrosis excluded).  Will request FibroScan from [facility].  Further recommendations to follow.  3.  History  of colon polyps-patient concerned about 5-year recall given her history of polyps.  Will plan on surveillance colonoscopy 2027.  Follow-up after upper endoscopy.  06/18/2024 11:41 AM   Disclaimer: This note was dictated with voice recognition software. Similar sounding words can inadvertently be transcribed and may not be corrected upon review.

## 2024-06-18 NOTE — H&P (View-Only) (Signed)
 Referring Provider: Kahoano, Haku K, MD Primary Care Physician:  Kahoano, Haku K, MD Primary GI:  Dr. Cindie  Chief Complaint  Patient presents with   Follow-up    Patient here today due to having some issues with epigastric pain, bloating,vomiting and diarrhea. She takes omeprazole  40 mg prn, but says she does not use it daily as it causes issues with constipation.     HPI:   Chelsey Jordan is a 48 y.o. female who presents to clinic for follow-up visit.  Last seen in our office August 2024.  Patient is Spanish-speaking only, in person interpreter used.  Multiple GI complaints for me today.    Abdominal pain, nausea, bloating: Patient reports worsening abdominal pain.  Primarily epigastric, reports associated abdominal bloating.  Worse after meals.  Started approximately 2 months ago.  Also having associated nausea without vomiting.  States initially when her symptoms started she also had diarrhea but this is improved.  Has been taking omeprazole  40 mg daily as needed.  If she takes it every day then this will cause constipation.  EGD 02/20/2023: Normal esophagus, gastritis and duodenitis biopsied.  Pathology with mild reactive gastropathy, negative for H. pylori.  Duodenal biopsies benign.  Recommended PPI twice daily   Denies any NSAID use.  Occasional dysphagia as well.  Feels like food will get stuck in her substernal region.  Denies any food regurgitation.  MASLD: Patient reports history of fatty liver disease.  Most recent blood work with normal enzymes.  States she had a FibroScan done last year at [facility] although I cannot see this.  Risk factors include diabetes, obesity and (BMI 44.44).  History of colon polyps: Colonoscopy 02/20/23: Nonbleeding internal hemorrhoids, normal TI, 4 mm polyp in the sigmoid colon removed, 5 mm polyp in the ascending colon removed.  Pathology with tubular adenomas.  Recommended repeat colonoscopy in 5 years.   Past Medical History:   Diagnosis Date   Asthma    Hypertension    Vaginal Pap smear, abnormal     Past Surgical History:  Procedure Laterality Date   ANTERIOR CERVICAL DECOMP/DISCECTOMY FUSION N/A 04/25/2024   Procedure: ANTERIOR CERVICAL DECOMPRESSION/DISCECTOMY FUSION CERVICAL SIX-CERVICAL SEVEN;  Surgeon: Onetha Kuba, MD;  Location: [facility] OR;  Service: Neurosurgery;  Laterality: N/A;   BIOPSY  02/20/2023   Procedure: BIOPSY;  Surgeon: Cindie Carlin POUR, DO;  Location: [facility];  Service: Endoscopy;;   BREAST BIOPSY     CESAREAN SECTION     x2   COLONOSCOPY  04/05/2021   Dr. Ernesto; 3 tubular adenomas, 2 hyperplastic polyps.  Recommended 3-year surveillance.   COLONOSCOPY WITH PROPOFOL  N/A 02/20/2023   Dr. Cindie; Nonbleeding internal hemorrhoids, normal TI, 4 mm polyp in the sigmoid colon removed, 5 mm polyp in the ascending colon removed.  Pathology with tubular adenomas.  Recommended repeat colonoscopy in 5 years.   ESOPHAGOGASTRODUODENOSCOPY (EGD) WITH PROPOFOL  N/A 02/20/2023   Procedure: ESOPHAGOGASTRODUODENOSCOPY (EGD) WITH PROPOFOL ;  Surgeon: Cindie Carlin POUR, DO;  Location: [facility];  Service: Endoscopy;  Laterality: N/A;   GALLBLADDER SURGERY     TUBAL LIGATION      Current Outpatient Medications  Medication Sig Dispense Refill   glipiZIDE 2.5 MG TABS Take 1 tablet by mouth daily.     methylPREDNISolone (MEDROL DOSEPAK) 4 MG TBPK tablet daily at 6 (six) AM.     omeprazole  (PRILOSEC) 40 MG capsule Take 40 mg by mouth daily. (Patient taking differently: Take 40 mg by mouth as  needed.)     SYMBICORT 160-4.5 MCG/ACT inhaler Inhale 2 puffs into the lungs in the morning and at bedtime.     VENTOLIN  HFA 108 (90 Base) MCG/ACT inhaler Inhale 1-2 puffs into the lungs every 4 (four) hours as needed for wheezing or shortness of breath.     Vitamin D , Ergocalciferol , (DRISDOL ) 1.25 MG (50000 UNIT) CAPS capsule Take 50,000 Units by mouth every Wednesday.     No current facility-administered medications  for this visit.    Allergies as of 06/18/2024   (No Known Allergies)    Family History  Problem Relation Age of Onset   Hypertension Mother    Lung disease Mother    Lupus Mother    Hypertension Father    Heart disease Father    Crohn's disease Father    Cancer - Colon Neg Hx     Social History   Socioeconomic History   Marital status: Single    Spouse name: Not on file   Number of children: Not on file   Years of education: Not on file   Highest education level: Not on file  Occupational History   Not on file  Tobacco Use   Smoking status: Never   Smokeless tobacco: Never  Vaping Use   Vaping status: Never Used  Substance and Sexual Activity   Alcohol use: No   Drug use: No   Sexual activity: Not Currently    Partners: Male    Birth control/protection: Surgical  Other Topics Concern   Not on file  Social History Narrative   Not on file   Social Drivers of Health   Financial Resource Strain: Not on file  Food Insecurity: Not on file  Transportation Needs: Not on file  Physical Activity: Not on file  Stress: Not on file  Social Connections: Not on file    Subjective: Review of Systems  Constitutional:  Negative for chills and fever.  HENT:  Negative for congestion and hearing loss.   Eyes:  Negative for blurred vision and double vision.  Respiratory:  Negative for cough and shortness of breath.   Cardiovascular:  Negative for chest pain and palpitations.  Gastrointestinal:  Negative for abdominal pain, blood in stool, constipation, diarrhea, heartburn, melena and vomiting.  Genitourinary:  Negative for dysuria and urgency.  Musculoskeletal:  Negative for joint pain and myalgias.  Skin:  Negative for itching and rash.  Neurological:  Negative for dizziness and headaches.  Psychiatric/Behavioral:  Negative for depression. The patient is not nervous/anxious.      Objective: BP 139/85 (BP Location: Left Arm, Patient Position: Sitting, Cuff Size:  Large)   Pulse 99   Temp (!) 97.1 F (36.2 C) (Temporal)   Ht 5' 1 (1.549 m)   Wt 235 lb 3.2 oz (106.7 kg)   LMP 04/04/2023 Comment: After 2 years came back july 17  BMI 44.44 kg/m  Physical Exam Constitutional:      Appearance: Normal appearance.  HENT:     Head: Normocephalic and atraumatic.  Eyes:     Extraocular Movements: Extraocular movements intact.     Conjunctiva/sclera: Conjunctivae normal.  Cardiovascular:     Rate and Rhythm: Normal rate and regular rhythm.  Pulmonary:     Effort: Pulmonary effort is normal.     Breath sounds: Normal breath sounds.  Abdominal:     General: Bowel sounds are normal.     Palpations: Abdomen is soft.  Musculoskeletal:        General: No swelling.  Normal range of motion.     Cervical back: Normal range of motion and neck supple.  Skin:    General: Skin is warm and dry.     Coloration: Skin is not jaundiced.  Neurological:     General: No focal deficit present.     Mental Status: She is alert and oriented to person, place, and time.  Psychiatric:        Mood and Affect: Mood normal.        Behavior: Behavior normal.      Assessment/Plan:  1.  Abdominal pain, nausea, bloating, esophageal dysphagia- Will schedule for EGD with possible dilation to evaluate for peptic ulcer disease, esophagitis, gastritis, H. Pylori, duodenitis, or other. Will also evaluate for esophageal stricture, Schatzki's ring, esophageal web or other.   The risks including infection, bleed, or perforation as well as benefits, limitations, alternatives and imponderables have been reviewed with the patient. Potential for esophageal dilation, biopsy, etc. have also been reviewed.  Questions have been answered. All parties agreeable.  Will change omeprazole  to pantoprazole  40 mg daily.  2.  MASLD-based on blood work last year, FIB4 0.68 (advanced fibrosis excluded).  Will request FibroScan from [facility].  Further recommendations to follow.  3.  History  of colon polyps-patient concerned about 5-year recall given her history of polyps.  Will plan on surveillance colonoscopy 2027.  Follow-up after upper endoscopy.  06/18/2024 11:41 AM   Disclaimer: This note was dictated with voice recognition software. Similar sounding words can inadvertently be transcribed and may not be corrected upon review.

## 2024-06-19 ENCOUNTER — Other Ambulatory Visit: Payer: Self-pay | Admitting: *Deleted

## 2024-06-19 NOTE — Telephone Encounter (Signed)
 Spoke with pt. She has been schedled for EGD with Dr. Cindie, (ASA 3 ok rm 1) on 10/8. Aware will send instructions to her mychart.   PA submitted via [facility] [id number]

## 2024-06-23 ENCOUNTER — Encounter (HOSPITAL_COMMUNITY): Payer: Self-pay

## 2024-06-23 ENCOUNTER — Encounter (HOSPITAL_COMMUNITY)
Admission: RE | Admit: 2024-06-23 | Discharge: 2024-06-23 | Disposition: A | Discharge status: Home / Self Care | Source: Ambulatory Visit | Attending: Internal Medicine | Admitting: Internal Medicine

## 2024-06-23 HISTORY — DX: Fatty (change of) liver, not elsewhere classified: K76.0

## 2024-06-23 NOTE — Telephone Encounter (Signed)
 PA approved via [facility] [id number] DOS: 06/25/2024-09/23/2024

## 2024-06-24 ENCOUNTER — Telehealth: Payer: Self-pay | Admitting: *Deleted

## 2024-06-24 ENCOUNTER — Encounter: Payer: Self-pay | Admitting: *Deleted

## 2024-06-24 NOTE — Telephone Encounter (Signed)
 Pt informed needed to reschedule procedure for tomorrow due to provider being out sick. Pt has been rescheduled to 07/07/24. Updated instructions sent via mychart.

## 2024-07-03 ENCOUNTER — Encounter (HOSPITAL_COMMUNITY)
Admission: RE | Admit: 2024-07-03 | Discharge: 2024-07-03 | Disposition: A | Discharge status: Home / Self Care | Source: Ambulatory Visit | Attending: Internal Medicine | Admitting: Internal Medicine

## 2024-07-03 ENCOUNTER — Encounter (HOSPITAL_COMMUNITY): Payer: Self-pay

## 2024-07-03 ENCOUNTER — Other Ambulatory Visit: Payer: Self-pay

## 2024-07-07 ENCOUNTER — Ambulatory Visit (HOSPITAL_COMMUNITY): Admitting: Anesthesiology

## 2024-07-07 ENCOUNTER — Ambulatory Visit (HOSPITAL_COMMUNITY)
Admission: RE | Admit: 2024-07-07 | Discharge: 2024-07-07 | Disposition: A | Discharge status: Home / Self Care | Attending: Internal Medicine | Admitting: Internal Medicine

## 2024-07-07 ENCOUNTER — Encounter (HOSPITAL_COMMUNITY): Payer: Self-pay | Admitting: Internal Medicine

## 2024-07-07 ENCOUNTER — Encounter (HOSPITAL_COMMUNITY)
Admission: RE | Disposition: A | Discharge status: Home / Self Care | Payer: Self-pay | Source: Home / Self Care | Attending: Internal Medicine

## 2024-07-07 DIAGNOSIS — K2970 Gastritis, unspecified, without bleeding: Secondary | ICD-10-CM | POA: Insufficient documentation

## 2024-07-07 DIAGNOSIS — Z79899 Other long term (current) drug therapy: Secondary | ICD-10-CM | POA: Insufficient documentation

## 2024-07-07 DIAGNOSIS — Z6841 Body Mass Index (BMI) 40.0 and over, adult: Secondary | ICD-10-CM | POA: Diagnosis not present

## 2024-07-07 DIAGNOSIS — R140 Abdominal distension (gaseous): Secondary | ICD-10-CM | POA: Diagnosis not present

## 2024-07-07 DIAGNOSIS — I10 Essential (primary) hypertension: Secondary | ICD-10-CM

## 2024-07-07 DIAGNOSIS — E669 Obesity, unspecified: Secondary | ICD-10-CM | POA: Insufficient documentation

## 2024-07-07 DIAGNOSIS — J45909 Unspecified asthma, uncomplicated: Secondary | ICD-10-CM | POA: Diagnosis not present

## 2024-07-07 DIAGNOSIS — K2980 Duodenitis without bleeding: Secondary | ICD-10-CM | POA: Diagnosis not present

## 2024-07-07 DIAGNOSIS — K760 Fatty (change of) liver, not elsewhere classified: Secondary | ICD-10-CM | POA: Insufficient documentation

## 2024-07-07 DIAGNOSIS — E119 Type 2 diabetes mellitus without complications: Secondary | ICD-10-CM | POA: Insufficient documentation

## 2024-07-07 DIAGNOSIS — R1013 Epigastric pain: Secondary | ICD-10-CM | POA: Insufficient documentation

## 2024-07-07 LAB — GLUCOSE, CAPILLARY: Glucose-Capillary: 106 mg/dL — ABNORMAL HIGH (ref 70–99)

## 2024-07-07 SURGERY — EGD (ESOPHAGOGASTRODUODENOSCOPY)
Anesthesia: General

## 2024-07-07 MED ORDER — LIDOCAINE 2% (20 MG/ML) 5 ML SYRINGE
INTRAMUSCULAR | Status: DC | PRN
Start: 2024-07-07 — End: 2024-07-07
  Administered 2024-07-07: 50 mg via INTRAVENOUS

## 2024-07-07 MED ORDER — PROPOFOL 10 MG/ML IV BOLUS
INTRAVENOUS | Status: DC | PRN
  Administered 2024-07-07: 150 mg via INTRAVENOUS
  Administered 2024-07-07: 30 mg via INTRAVENOUS

## 2024-07-07 MED ORDER — LACTATED RINGERS IV SOLN: INTRAVENOUS | Status: DC

## 2024-07-07 MED ORDER — PANTOPRAZOLE SODIUM 40 MG PO TBEC
40.0000 mg | DELAYED_RELEASE_TABLET | Freq: Two times a day (BID) | ORAL | 11 refills | Status: AC
Start: 2024-07-07 — End: 2025-07-07

## 2024-07-07 NOTE — Interval H&P Note (Signed)
 History and Physical Interval Note:  07/07/2024 8:54 AM  Chelsey Jordan  has presented today for surgery, with the diagnosis of upper bad pain, bloating, nausea.  The various methods of treatment have been discussed with the patient and family. After consideration of risks, benefits and other options for treatment, the patient has consented to  Procedure(s) with comments: EGD (ESOPHAGOGASTRODUODENOSCOPY) (N/A) - 1015am as a surgical intervention.  The patient's history has been reviewed, patient examined, no change in status, stable for surgery.  I have reviewed the patient's chart and labs.  Questions were answered to the patient's satisfaction.     Carlin MARLA Hasty

## 2024-07-07 NOTE — Discharge Instructions (Addendum)
 Su EGD revel inflamacin en el estmago e intestino delgado. Le realic biopsias para descartar una infeccin por la bacteria H. pylori. Espere los resultados de Agricultural consultant; mi consultorio se pondr en contacto con usted.  Voy a aumentar su dosis de pantoprazol a [employer]. Envi esto a su farmacia.  Seguimiento en la consulta de gastroenterologa en 3 meses.  Dr. Cindie

## 2024-07-07 NOTE — Transfer of Care (Signed)
 Immediate Anesthesia Transfer of Care Note  Patient: Chelsey Jordan  Procedure(s) Performed: EGD (ESOPHAGOGASTRODUODENOSCOPY)  Patient Location: Short Stay  Anesthesia Type:General  Level of Consciousness: awake  Airway & Oxygen Therapy: Patient Spontanous Breathing  Post-op Assessment: Report given to RN  Post vital signs: Reviewed and stable  Last Vitals:  Vitals Value Taken Time  BP    Temp    Pulse    Resp    SpO2      Last Pain:  Vitals:   07/07/24 0932  TempSrc:   PainSc: 0-No pain      Patients Stated Pain Goal: 6 (07/07/24 0813)  Complications: No notable events documented.

## 2024-07-07 NOTE — Anesthesia Preprocedure Evaluation (Signed)
 Anesthesia Evaluation  Patient identified by MRN, date of birth, ID band Patient awake    Reviewed: Allergy & Precautions, H&P , NPO status , Patient's Chart, lab work & pertinent test results, reviewed documented beta blocker date and time   Airway Mallampati: II  TM Distance: >3 FB Neck ROM: full    Dental no notable dental hx.    Pulmonary asthma    Pulmonary exam normal breath sounds clear to auscultation       Cardiovascular Exercise Tolerance: Good hypertension,  Rhythm:regular Rate:Normal     Neuro/Psych negative neurological ROS  negative psych ROS   GI/Hepatic negative GI ROS, Neg liver ROS,,,  Endo/Other  negative endocrine ROS    Renal/GU negative Renal ROS  negative genitourinary   Musculoskeletal   Abdominal   Peds  Hematology negative hematology ROS (+)   Anesthesia Other Findings   Reproductive/Obstetrics negative OB ROS                              Anesthesia Physical Anesthesia Plan  ASA: 2  Anesthesia Plan: General   Post-op Pain Management:    Induction:   PONV Risk Score and Plan: Propofol  infusion  Airway Management Planned:   Additional Equipment:   Intra-op Plan:   Post-operative Plan:   Informed Consent: I have reviewed the patients History and Physical, chart, labs and discussed the procedure including the risks, benefits and alternatives for the proposed anesthesia with the patient or authorized representative who has indicated his/her understanding and acceptance.     Dental Advisory Given  Plan Discussed with: CRNA  Anesthesia Plan Comments:         Anesthesia Quick Evaluation

## 2024-07-07 NOTE — Op Note (Signed)
 [facility] Patient Name: Chelsey Jordan Procedure Date: 07/07/2024 9:16 AM MRN: [id number] Date of Birth: Jul 06, 1976 Attending MD: Chelsey Jordan , [place], [id number] CSN: [id number] Age: 48 Admit Type: Outpatient Procedure:                Upper GI endoscopy Indications:              Epigastric abdominal pain, Abdominal bloating,                            Nausea Providers:                Chelsey POUR. Cindie, DO, Chelsey LABOR. Gerome RN, RN,                            Chelsey Jordan Referring MD:              Medicines:                See the Anesthesia note for documentation of the                            administered medications Complications:            No immediate complications. Estimated Blood Loss:     Estimated blood loss was minimal. Procedure:                Pre-Anesthesia Assessment:                           - The anesthesia plan was to use monitored                            anesthesia care (MAC).                           After obtaining informed consent, the endoscope was                            passed under direct vision. Throughout the                            procedure, the patient's blood pressure, pulse, and                            oxygen saturations were monitored continuously. The                            [id number] ([id number]) Upper was introduced through                            the mouth, and advanced to the second part of                            duodenum. The upper GI endoscopy was accomplished                            without difficulty. The patient tolerated the  procedure well. Scope In: 9:36:19 AM Scope Out: 9:40:33 AM Total Procedure Duration: 0 hours 4 minutes 14 seconds  Findings:      The Z-line was regular and was found 36 cm from the incisors.      Patchy mild inflammation was found in the entire examined stomach.       Biopsies were taken with a cold forceps for Helicobacter pylori testing.       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       Biopsies were taken with a cold forceps for histology. Impression:               - Z-line regular, 36 cm from the incisors.                           - Gastritis. Biopsied.                           - Duodenitis. Biopsied. Moderate Sedation:      Per Anesthesia Care Recommendation:           - Patient has a contact number available for                            emergencies. The signs and symptoms of potential                            delayed complications were discussed with the                            patient. Return to normal activities tomorrow.                            Written discharge instructions were provided to the                            patient.                           - Resume previous diet.                           - Continue present medications.                           - Await pathology results.                           - Use a proton pump inhibitor PO BID.                           - Return to GI clinic in 3 months. Procedure Code(s):        --- Professional ---                           [phone], Esophagogastroduodenoscopy, flexible,  transoral; with biopsy, single or multiple Diagnosis Code(s):        --- Professional ---                           K29.70, Gastritis, unspecified, without bleeding                           K29.80, Duodenitis without bleeding                           R10.13, Epigastric pain                           R14.0, Abdominal distension (gaseous)                           R11.0, Nausea CPT copyright 2022 American Medical Association. All rights reserved. The codes documented in this report are preliminary and upon coder review may  be revised to meet current compliance requirements. Chelsey POUR. Cindie, DO Chelsey POUR. [name], DO 07/07/2024 9:45:18 AM This  report has been signed electronically. Number of Addenda: 0

## 2024-07-07 NOTE — Anesthesia Postprocedure Evaluation (Signed)
 Anesthesia Post Note  Patient: Chelsey Jordan  Procedure(s) Performed: EGD (ESOPHAGOGASTRODUODENOSCOPY)  Patient location during evaluation: Short Stay Anesthesia Type: General Level of consciousness: awake Pain management: pain level controlled Vital Signs Assessment: post-procedure vital signs reviewed and stable Respiratory status: spontaneous breathing Cardiovascular status: blood pressure returned to baseline Postop Assessment: no apparent nausea or vomiting Anesthetic complications: no   No notable events documented.   Last Vitals:  Vitals:   07/07/24 0946 07/07/24 0948  BP:  (!) 101/52  Pulse: 81   Resp: (!) 29   Temp: 36.7 C   SpO2: 95%     Last Pain:  Vitals:   07/07/24 0946  TempSrc: Axillary  PainSc:                  TORIBIO PAO

## 2024-07-08 ENCOUNTER — Encounter (HOSPITAL_COMMUNITY): Payer: Self-pay | Admitting: Internal Medicine

## 2024-07-08 LAB — SURGICAL PATHOLOGY

## 2024-07-23 ENCOUNTER — Other Ambulatory Visit (HOSPITAL_COMMUNITY): Payer: Self-pay | Admitting: Student

## 2024-07-23 DIAGNOSIS — R1310 Dysphagia, unspecified: Secondary | ICD-10-CM

## 2024-08-08 ENCOUNTER — Other Ambulatory Visit (HOSPITAL_COMMUNITY): Payer: Self-pay | Admitting: Student

## 2024-08-08 DIAGNOSIS — M5412 Radiculopathy, cervical region: Secondary | ICD-10-CM

## 2024-08-11 ENCOUNTER — Ambulatory Visit (HOSPITAL_COMMUNITY)
Admission: RE | Admit: 2024-08-11 | Discharge: 2024-08-11 | Disposition: A | Discharge status: Home / Self Care | Source: Ambulatory Visit | Attending: Adult Medicine | Admitting: Adult Medicine

## 2024-08-11 DIAGNOSIS — R0989 Other specified symptoms and signs involving the circulatory and respiratory systems: Secondary | ICD-10-CM | POA: Diagnosis not present

## 2024-08-11 DIAGNOSIS — R1310 Dysphagia, unspecified: Secondary | ICD-10-CM

## 2024-08-11 DIAGNOSIS — K219 Gastro-esophageal reflux disease without esophagitis: Secondary | ICD-10-CM | POA: Diagnosis not present

## 2024-08-11 NOTE — Progress Notes (Signed)
 Modified Barium Swallow Study  Patient Details  Name: Chelsey Jordan MRN: [id number] Date of Birth: [birth year]  Today's Date: 08/11/2024  Modified Barium Swallow completed.  Full report located under Chart Review in the Imaging Section.  History of Present Illness Pt is a 48 year old female arriving for an OP MBS due to complaint of irritation/globus since April 25, 2024 when pt had ACDF C6/7. Pt reports pressure when swallowing anything and also has constant throat clearing. Pt also suffered from GERD and had an endoscopy which showed stomach inflammation.   Clinical Impression Pt demonstrates no significant oropharyngeal dysphagia, but was observed to have appeareance of prominent cricopharyngeus muscle, though there was no obstruction of bolus flow. Pt also observed to have  constant throat clearing without any airway invasion. ACDF hardware visualized at C6/7; hardware not obstructing bolus flow. Esophageal sweep showed liquid stasis in the mid esophagus, pill passed to stomach.  Used biofeedback on MBS imaging to demonstrate location of larynx and throat clearing without any penetration. Emphasized that throat clearing appeared to be a referred sensation and suspected to be increasing pts laryngeal sensitivity and perpetuating the throat clearing cycle. Likely sensitivity from surgery and signs related to LPR/GERD are contributing to throat clear and globus. Pt recommended to f/u with ENT and counseled to research chronic throat clearing, GERD and low acidic diets. Recommended f/u with OP SLP for cough suppression therapy. Factors that may increase risk of adverse event in presence of aspiration Noe & Lianne 2021):    Swallow Evaluation Recommendations Recommendations: PO diet PO Diet Recommendation: Regular;Thin liquids (Level 0) Liquid Administration via: Cup;Straw Medication Administration: Whole meds with liquid Supervision: Patient able to self-feed Oral care  recommendations: Oral care BID (2x/day) Recommended consults: Consider ENT consultation   Consuelo Fort, MA CCC-SLP  Acute Rehabilitation Services Secure Chat Preferred Office [phone]    [place] 08/11/2024,3:15 PM

## 2024-08-13 ENCOUNTER — Ambulatory Visit (HOSPITAL_COMMUNITY)
Admission: RE | Admit: 2024-08-13 | Discharge: 2024-08-13 | Disposition: A | Discharge status: Home / Self Care | Source: Ambulatory Visit | Attending: Student | Admitting: Student

## 2024-08-13 DIAGNOSIS — M5412 Radiculopathy, cervical region: Secondary | ICD-10-CM | POA: Insufficient documentation

## 2024-09-01 ENCOUNTER — Ambulatory Visit: Payer: Self-pay | Admitting: Internal Medicine

## 2024-09-10 ENCOUNTER — Encounter: Payer: Self-pay | Admitting: Internal Medicine

## 2024-11-14 ENCOUNTER — Ambulatory Visit (HOSPITAL_COMMUNITY)
